# Patient Record
Sex: Female | Born: 1985 | Race: White | Hispanic: No | Marital: Married | Smoking: Never smoker
Health system: Southern US, Community
[De-identification: ages and names within clinical notes are randomized; demographics above are authoritative.]

## PROBLEM LIST (undated history)

## (undated) DIAGNOSIS — O139 Gestational [pregnancy-induced] hypertension without significant proteinuria, unspecified trimester: Secondary | ICD-10-CM

## (undated) DIAGNOSIS — E538 Deficiency of other specified B group vitamins: Secondary | ICD-10-CM

## (undated) DIAGNOSIS — B001 Herpesviral vesicular dermatitis: Secondary | ICD-10-CM

## (undated) DIAGNOSIS — I10 Essential (primary) hypertension: Secondary | ICD-10-CM

## (undated) HISTORY — DX: Gestational (pregnancy-induced) hypertension without significant proteinuria, unspecified trimester: O13.9

## (undated) HISTORY — PX: INTRAUTERINE DEVICE (IUD) INSERTION: SHX5877

## (undated) HISTORY — DX: Herpesviral vesicular dermatitis: B00.1

## (undated) HISTORY — DX: Deficiency of other specified B group vitamins: E53.8

## (undated) HISTORY — DX: Essential (primary) hypertension: I10

---
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
## 2005-08-06 ENCOUNTER — Emergency Department: Payer: Self-pay | Admitting: General Practice

## 2005-08-06 ENCOUNTER — Ambulatory Visit (HOSPITAL_COMMUNITY): Admission: RE | Admit: 2005-08-06 | Discharge: 2005-08-06 | Payer: Self-pay | Admitting: General Practice

## 2005-08-07 ENCOUNTER — Ambulatory Visit: Payer: Self-pay | Admitting: General Practice

## 2005-08-21 ENCOUNTER — Encounter: Payer: Self-pay | Admitting: General Practice

## 2006-05-22 ENCOUNTER — Ambulatory Visit: Payer: Self-pay | Admitting: Internal Medicine

## 2006-12-11 IMAGING — CR RIGHT HAND - COMPLETE 3+ VIEW
1 series · 3 of 3 positions shown · non-contrast
Comparison: none

REASON FOR EXAM: injury from [facility] :rm cardiac
COMMENTS:

PROCEDURE:     DXR - DXR HAND RT COMPLETE W/OBLIQUES  - August 06, 2005  [DATE]
RESULT:     Views of the RIGHT hand reveal a fracture through the base of
the first metacarpal.  I do not see other metacarpal fractures.  No
phalangeal fracture is seen. The bones of the wrist are grossly normal.

[Series 1: view not recorded · 0.17mm/px · 3 of 3 slices shown]
[im 1/3]
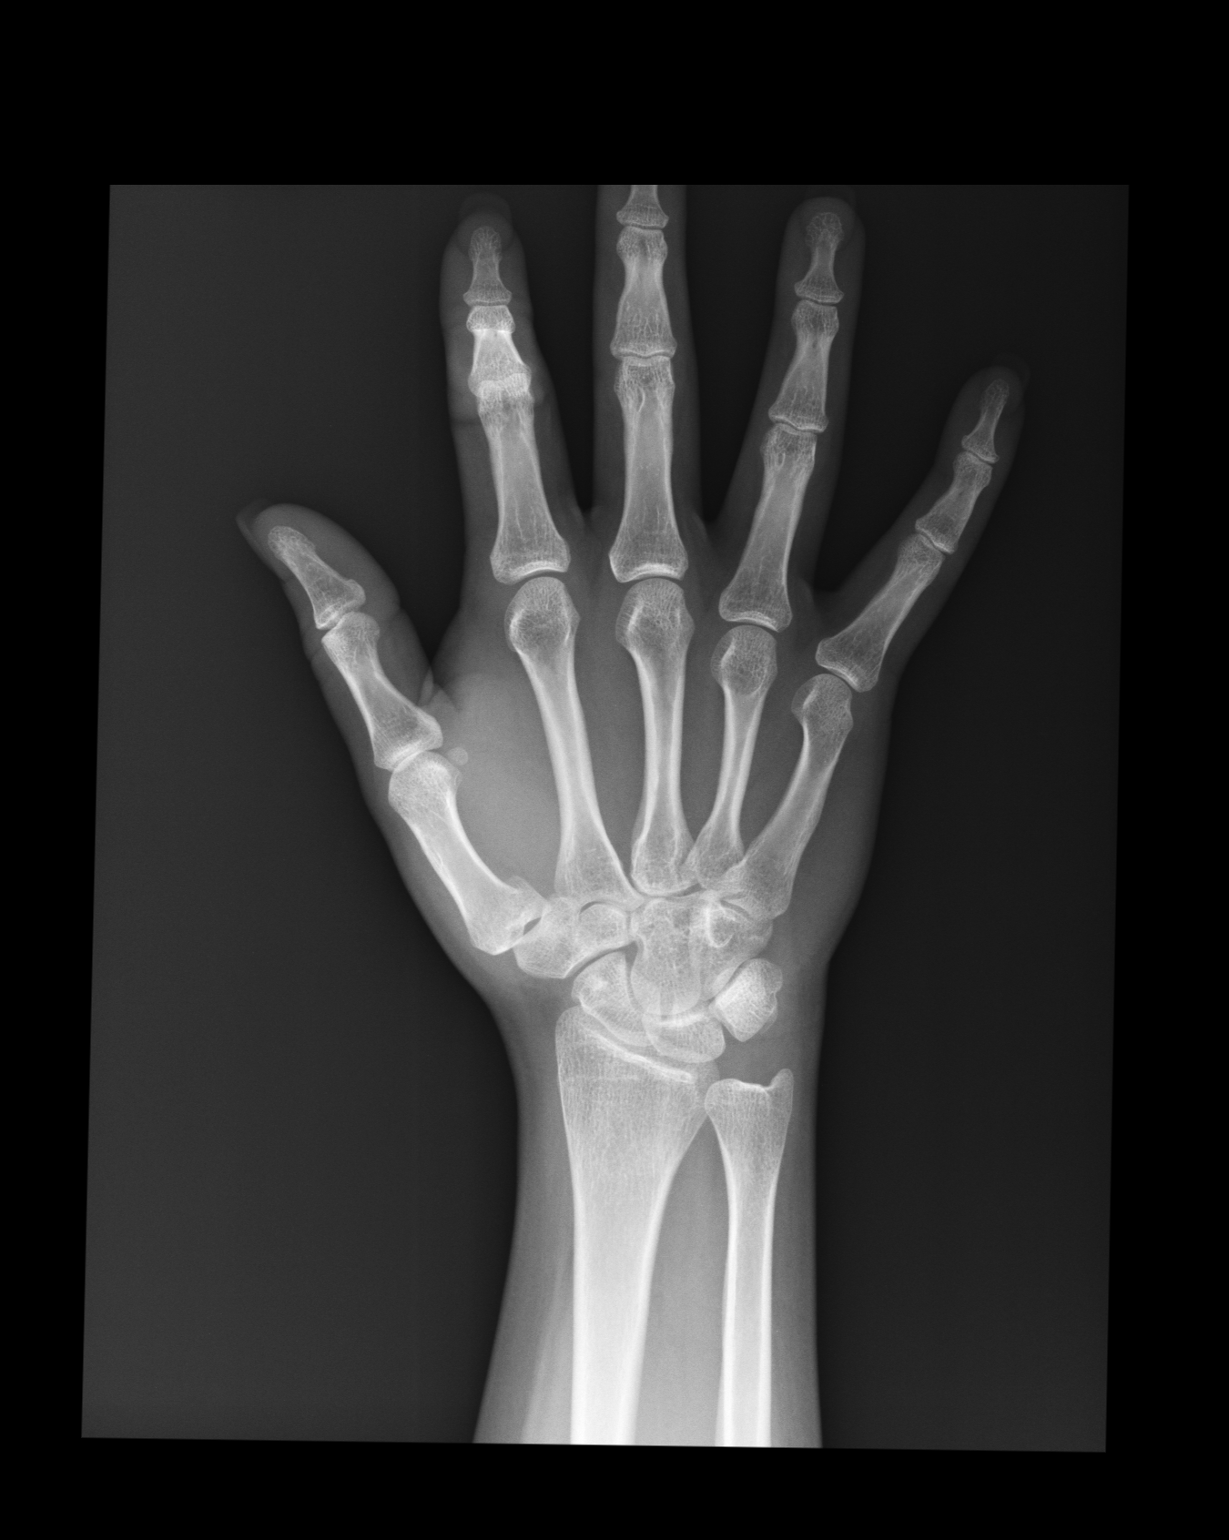
[im 2/3]
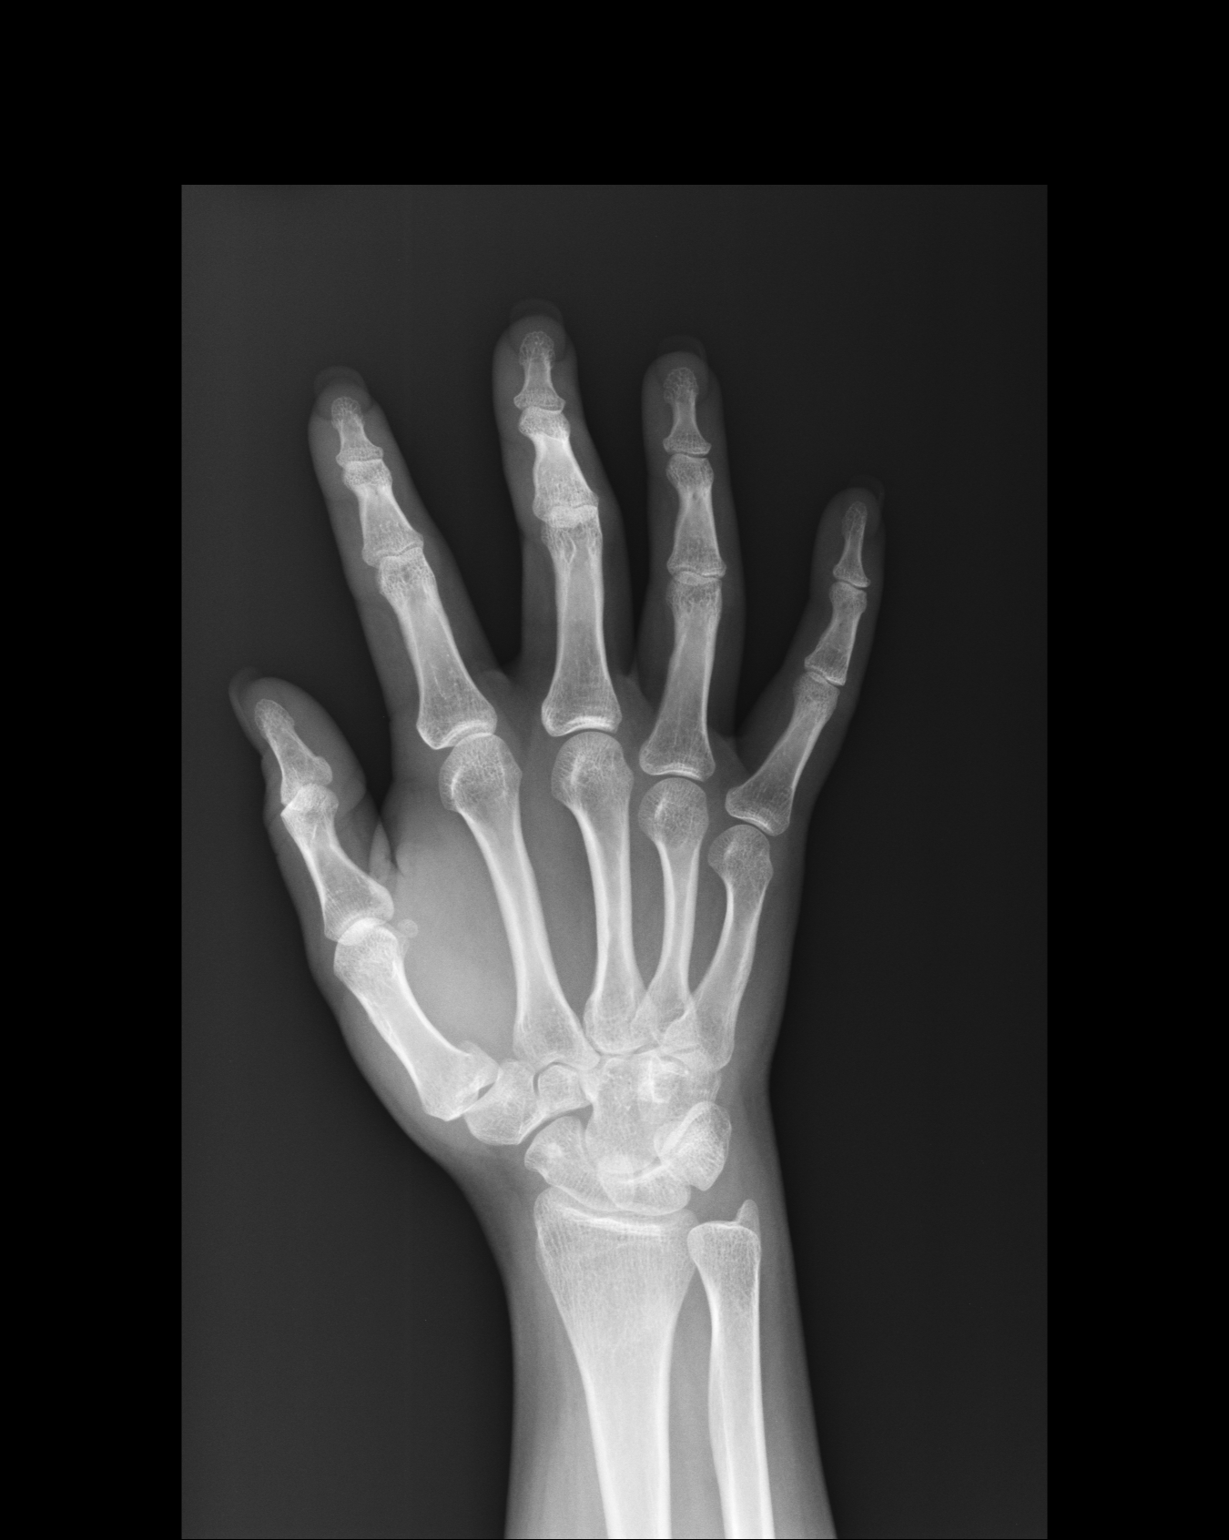
[im 3/3]
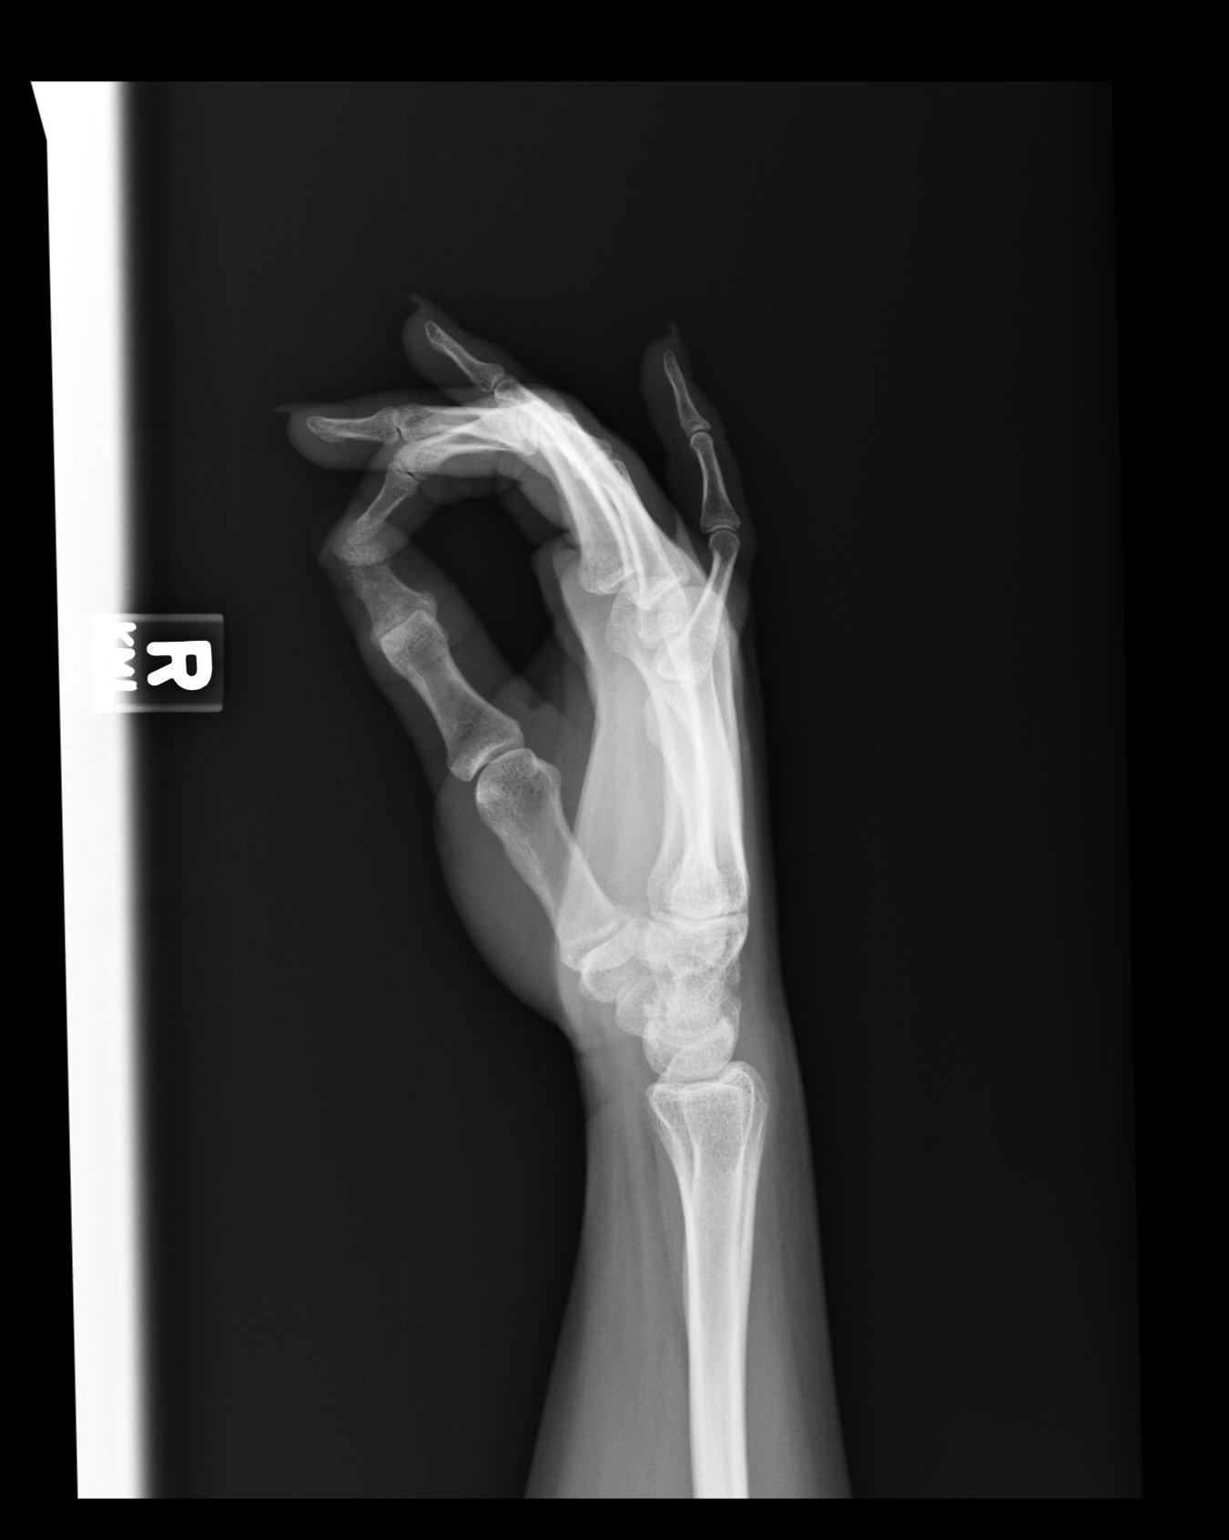

[3 of 3 positions shown; findings below may reference images not displayed]

IMPRESSION: 1)The patient sustained a fracture through the base of the first metacarpal.

## 2009-03-27 ENCOUNTER — Encounter: Payer: Self-pay | Admitting: Maternal and Fetal Medicine

## 2009-04-14 ENCOUNTER — Inpatient Hospital Stay: Payer: Self-pay | Admitting: Obstetrics & Gynecology

## 2009-04-24 ENCOUNTER — Encounter: Payer: Self-pay | Admitting: Maternal & Fetal Medicine

## 2009-04-27 ENCOUNTER — Encounter: Payer: Self-pay | Admitting: Obstetrics & Gynecology

## 2009-04-27 ENCOUNTER — Observation Stay: Payer: Self-pay

## 2009-04-29 ENCOUNTER — Observation Stay: Payer: Self-pay

## 2009-05-04 ENCOUNTER — Encounter: Payer: Self-pay | Admitting: Maternal and Fetal Medicine

## 2009-05-11 ENCOUNTER — Encounter: Payer: Self-pay | Admitting: Obstetrics & Gynecology

## 2009-05-15 ENCOUNTER — Inpatient Hospital Stay: Payer: Self-pay

## 2010-05-26 IMAGING — US US OB DETAIL+14 WK - NRPT [facility]
1 series · 14 of 28 positions shown · non-contrast
Comparison: none

[Series 1: us ob detail+14 wk - nrpt [facility] · 14 of 54 slices shown]
[im 2/54]
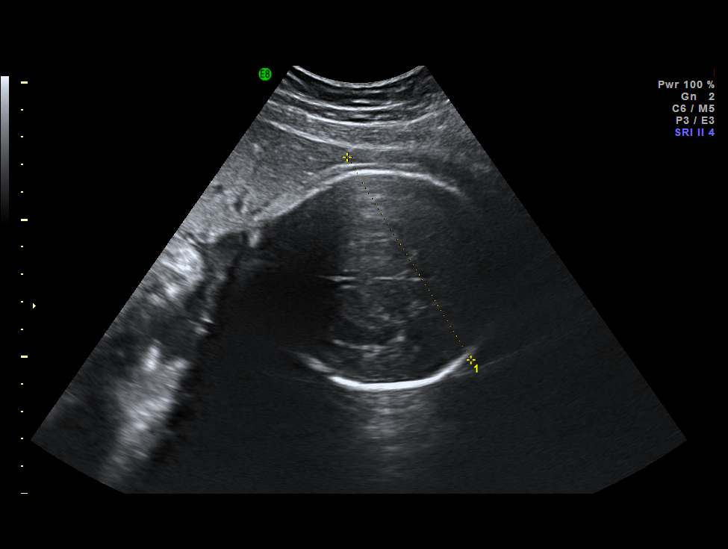
[im 6/54]
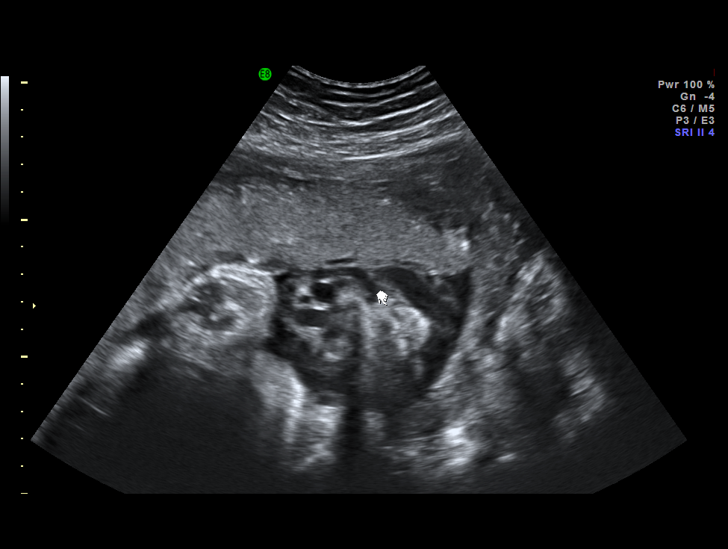
[im 10/54]
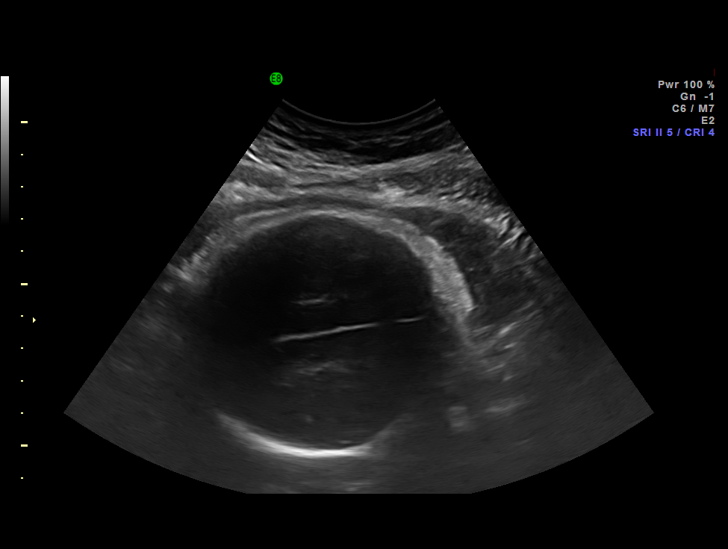
[im 14/54]
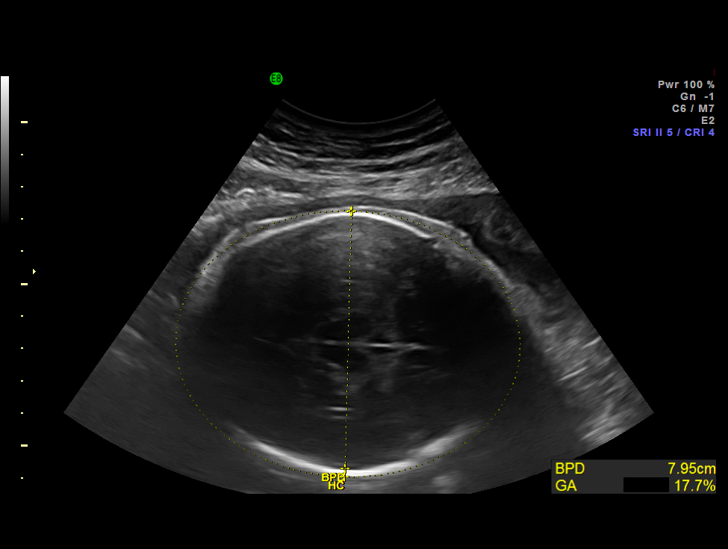
[im 18/54]
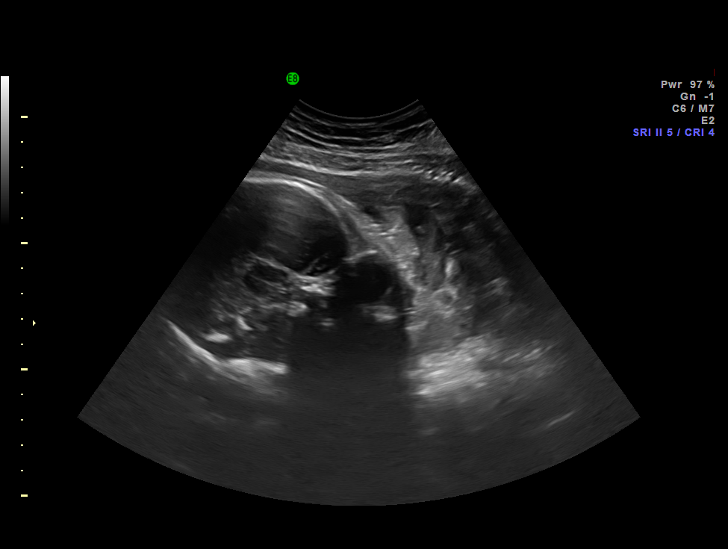
[im 22/54]
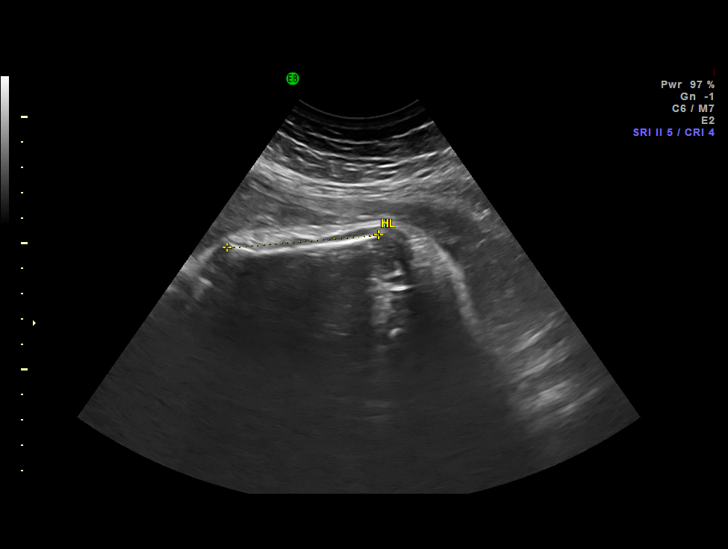
[im 26/54]
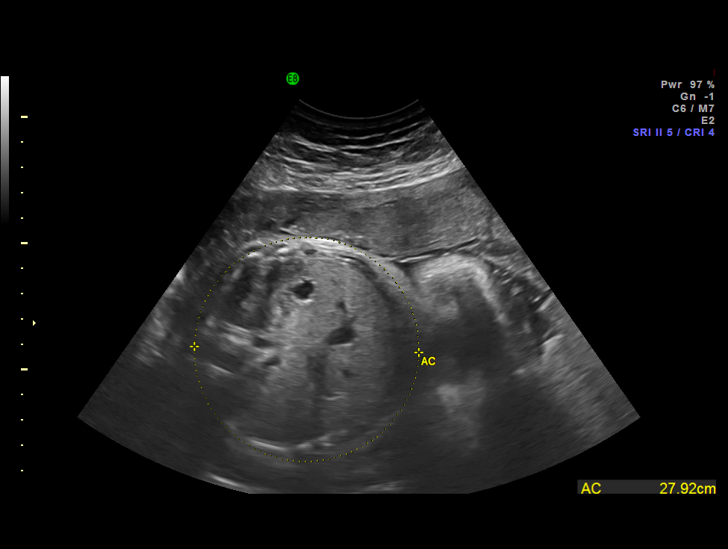
[im 30/54]
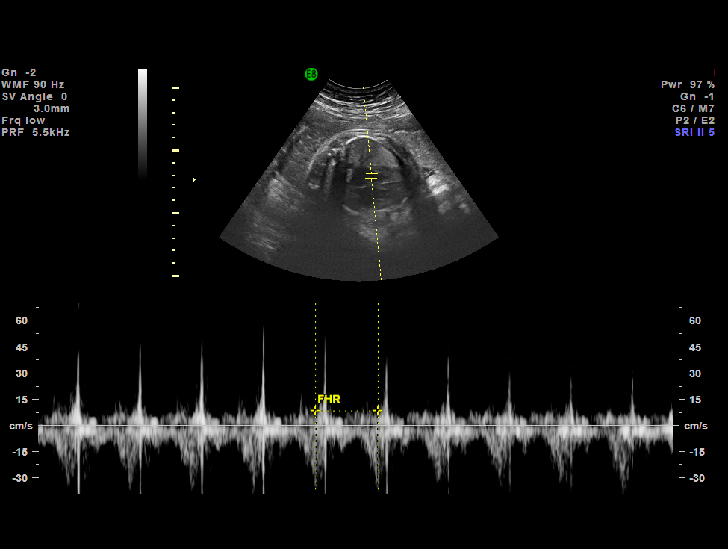
[im 34/54]
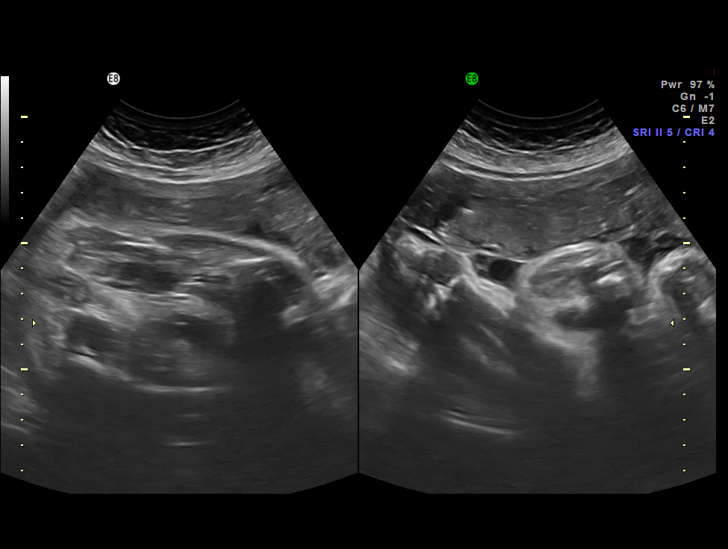
[im 38/54]
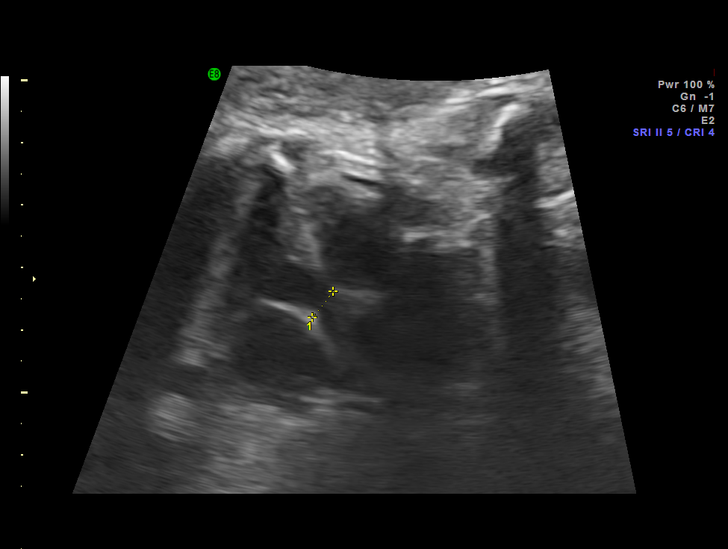
[im 42/54]
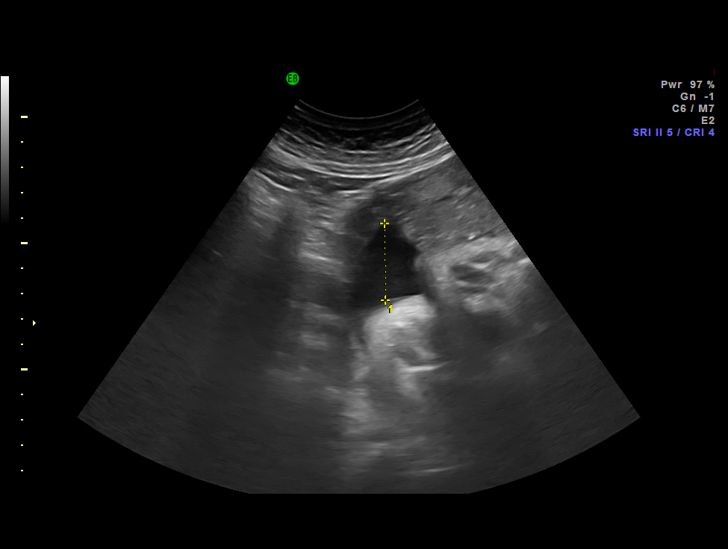
[im 46/54]
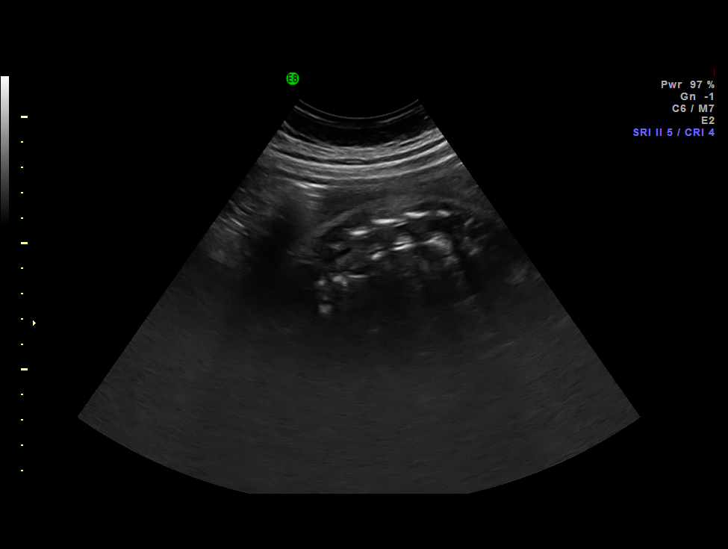
[im 50/54]
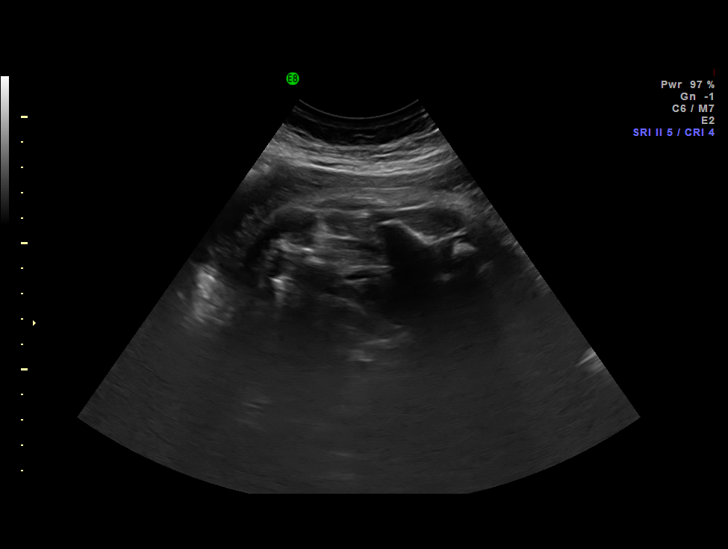
[im 54/54]
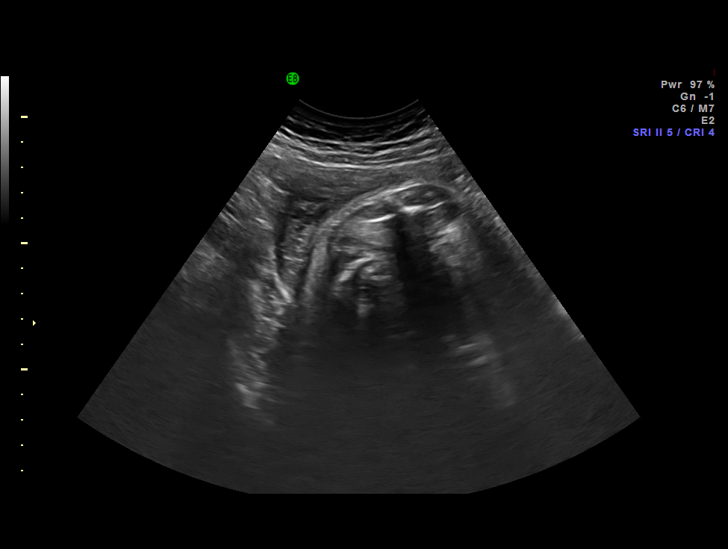

[14 of 28 positions shown; findings below may reference images not displayed]

IMAGES IMPORTED FROM THE SYNGO WORKFLOW SYSTEM
NO DICTATION FOR STUDY

## 2010-06-02 IMAGING — US ULTRAOUND OB LIMITED - NRPT [facility]
1 series · 13 of 13 positions shown · non-contrast
Comparison: none

[Series 1: ultraound ob limited - nrpt [facility] · 13 of 13 slices shown]
[im 1/13]
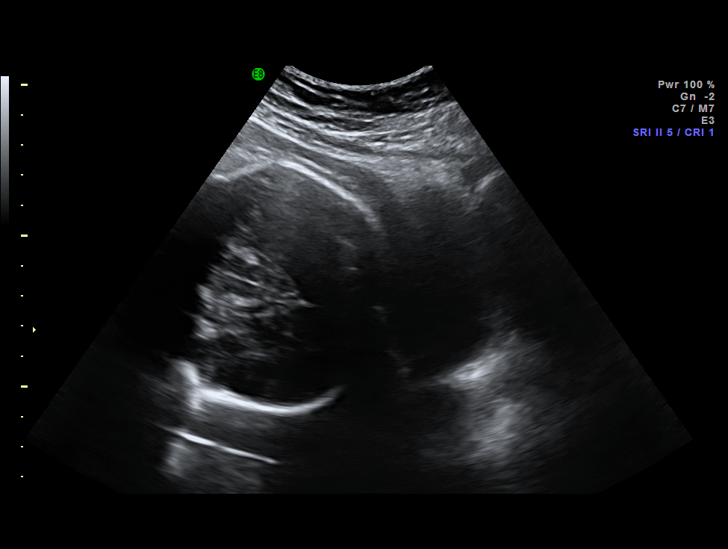
[im 2/13]
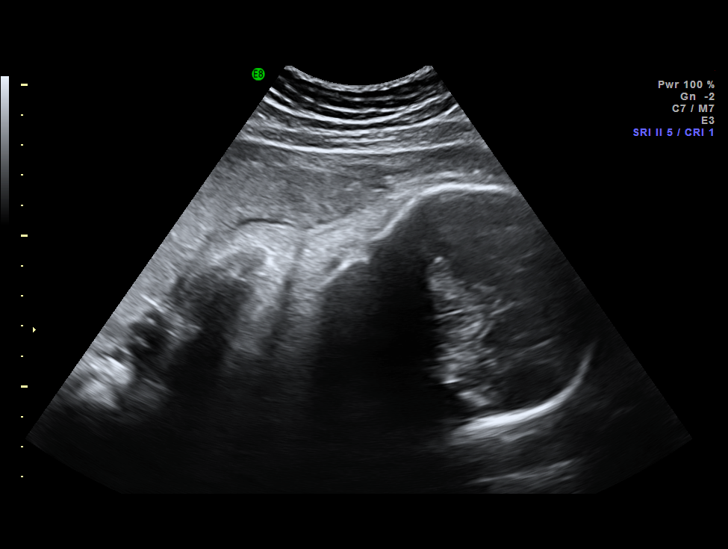
[im 3/13]
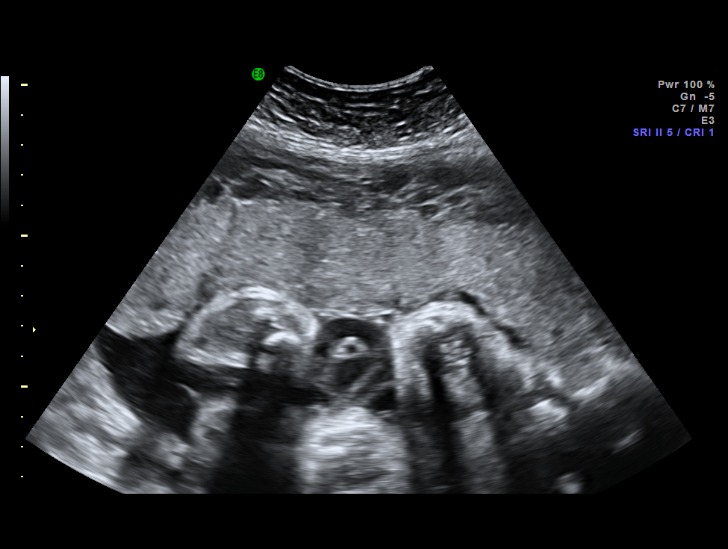
[im 4/13]
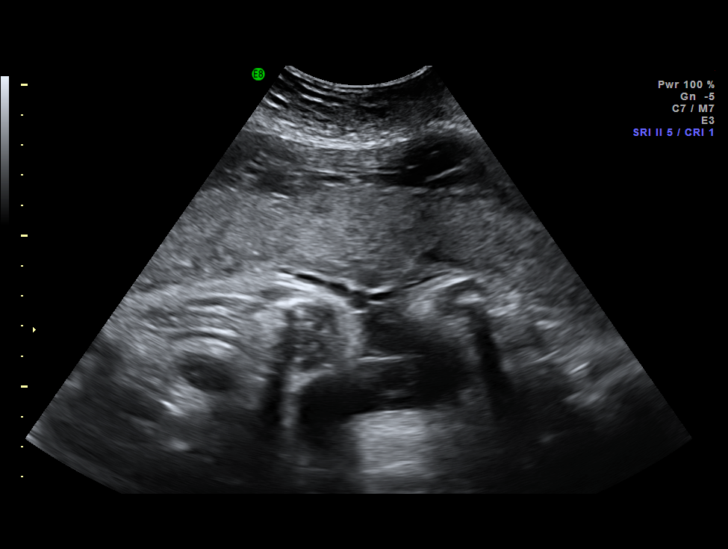
[im 5/13]
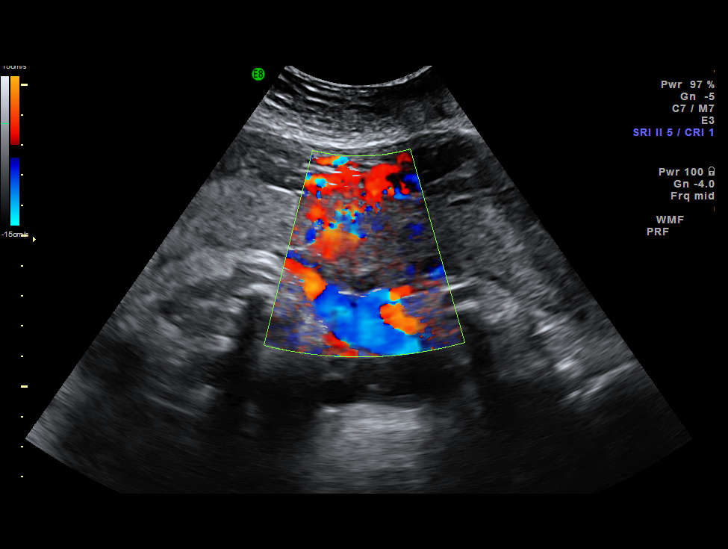
[im 6/13]
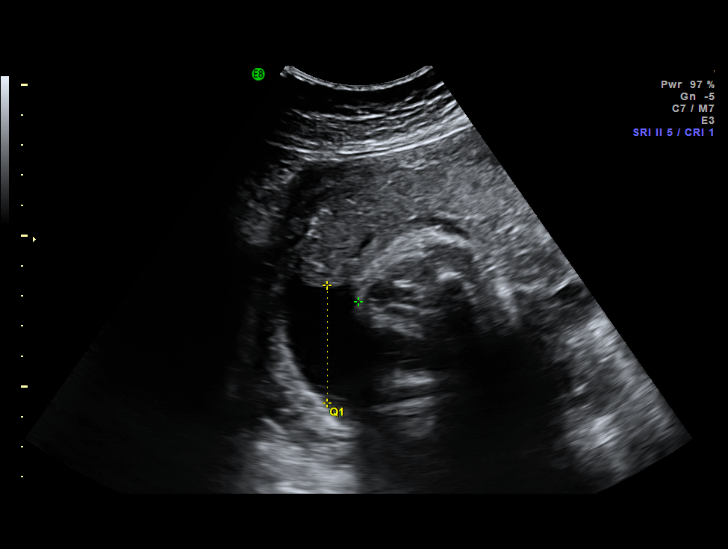
[im 7/13]
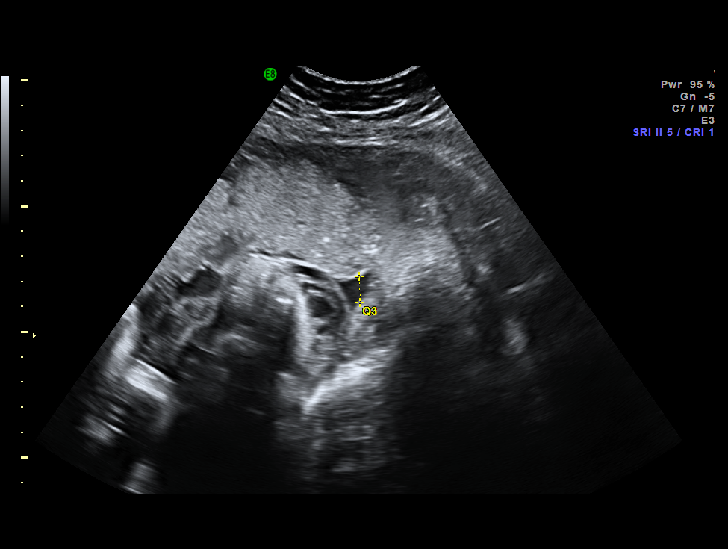
[im 8/13]
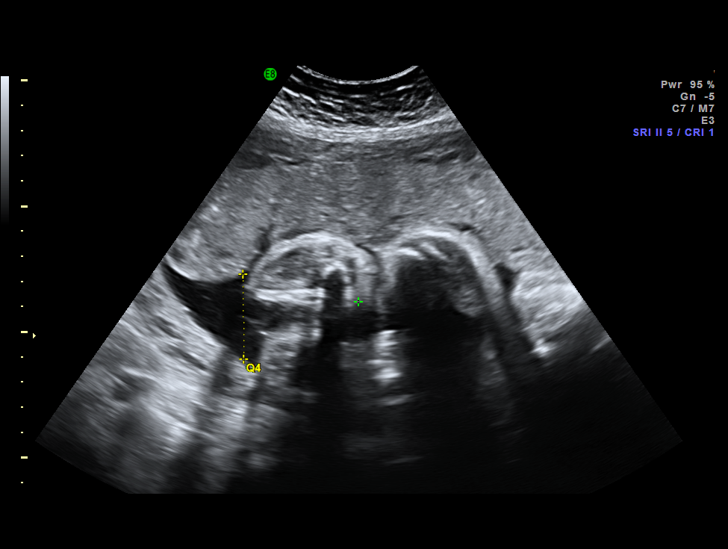
[im 9/13]
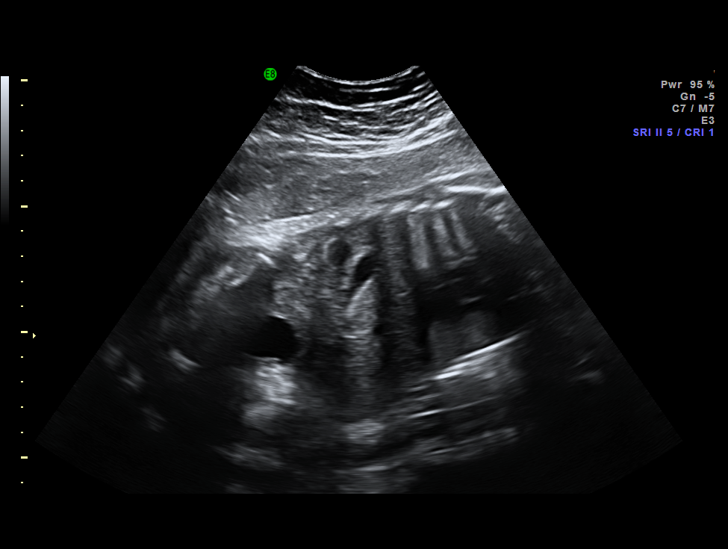
[im 10/13]
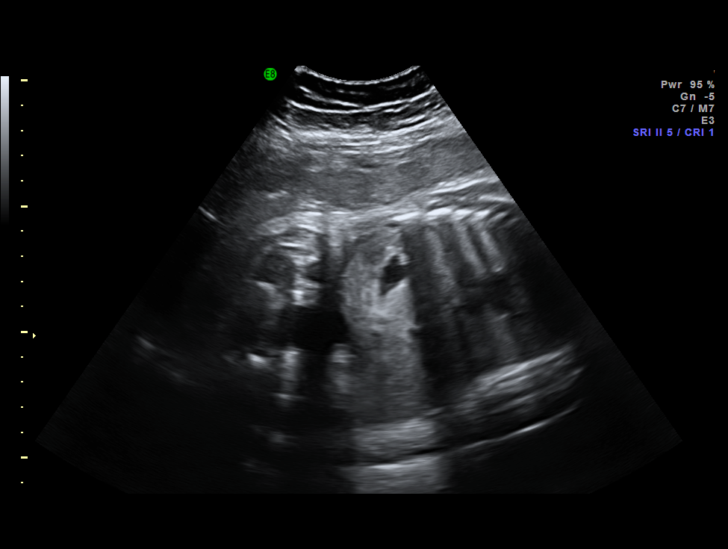
[im 11/13]
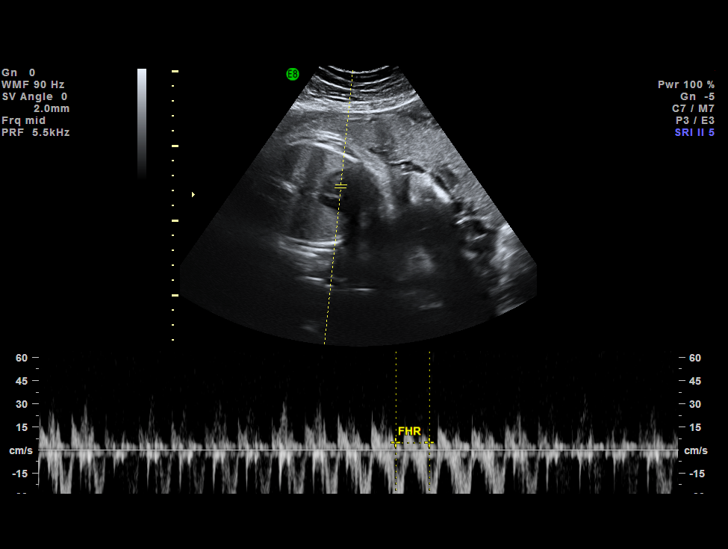
[im 12/13]
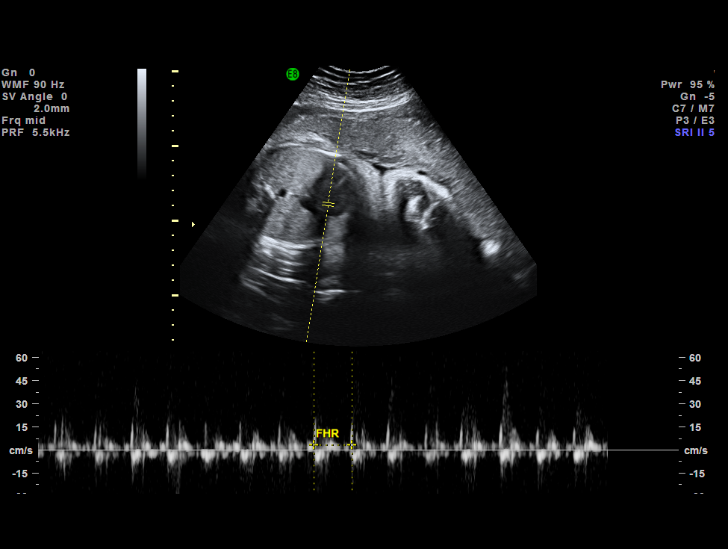
[im 13/13]
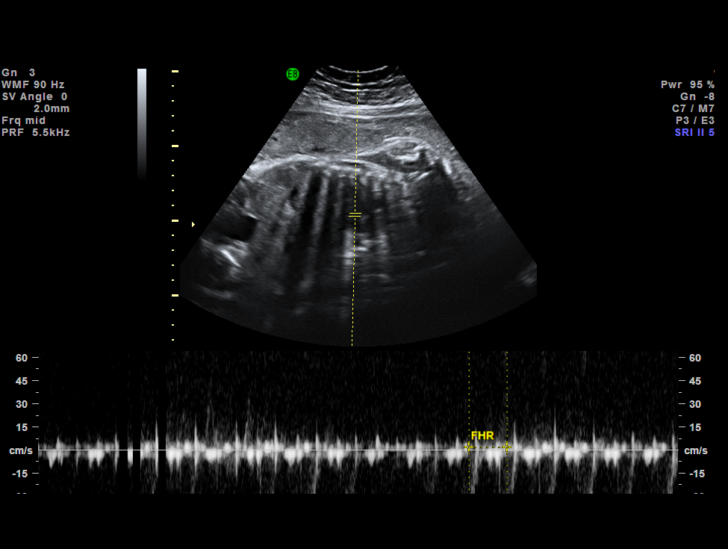

[13 of 13 positions shown; findings below may reference images not displayed]

IMAGES IMPORTED FROM THE SYNGO WORKFLOW SYSTEM
NO DICTATION FOR STUDY

## 2010-06-12 IMAGING — US US FETAL BPP W/O NON-STRESS - NRPT
1 series · 12 of 12 positions shown · non-contrast
Comparison: none

[Series 1: us fetal bpp w/o non-stress - nrpt · 12 of 12 slices shown]
[im 1/12]
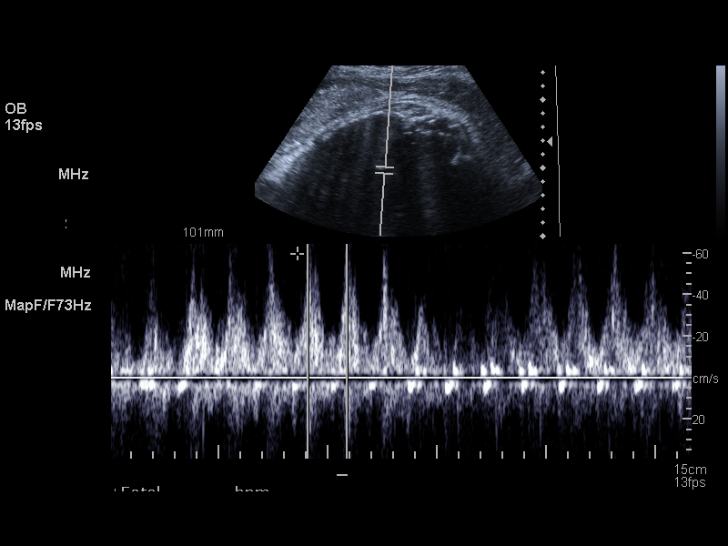
[im 2/12]
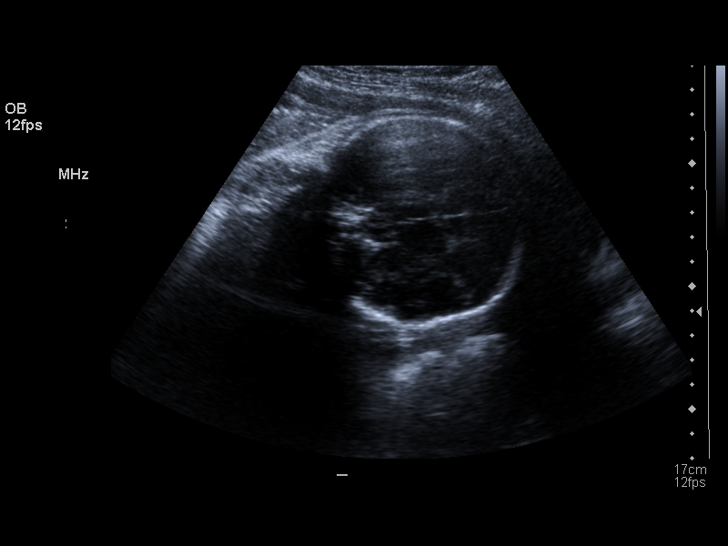
[im 3/12]
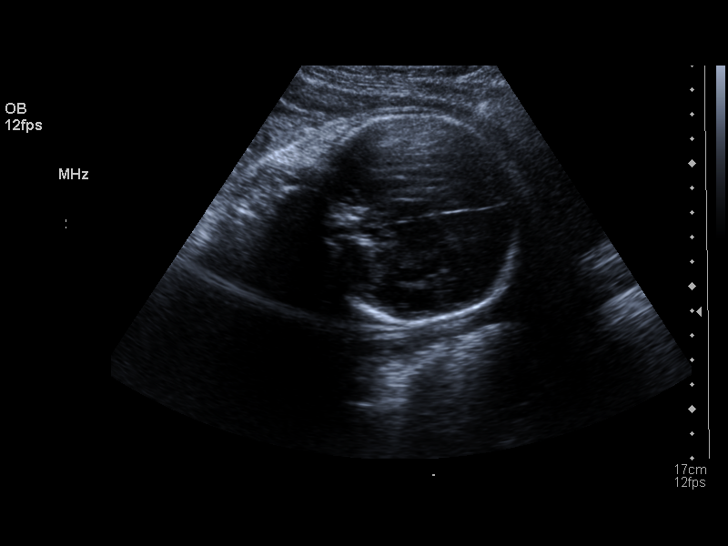
[im 4/12]
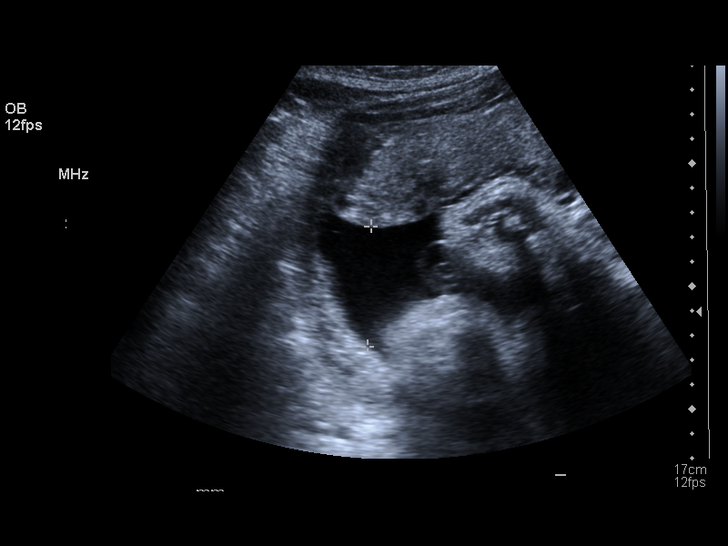
[im 5/12]
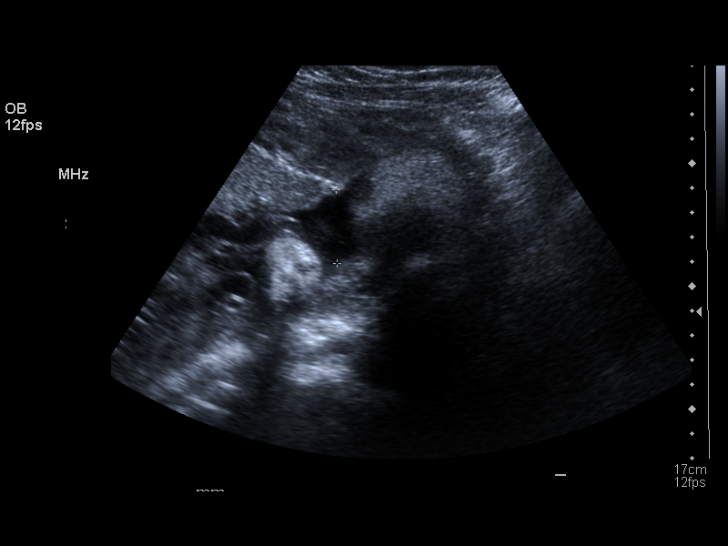
[im 6/12]
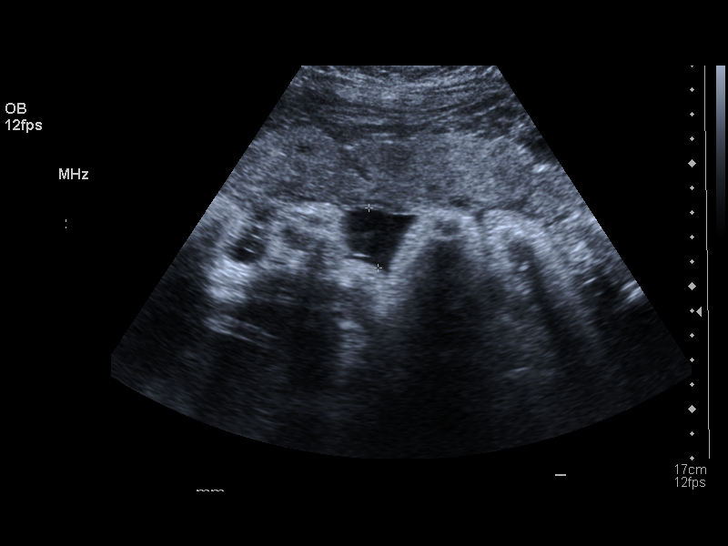
[im 7/12]
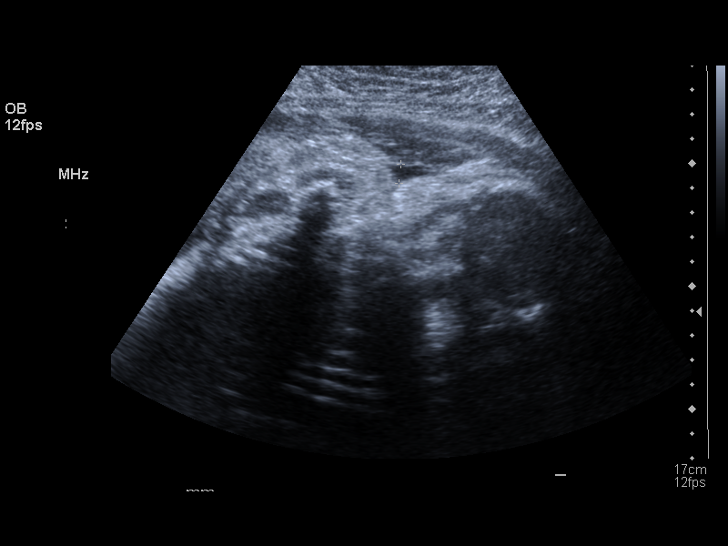
[im 8/12]
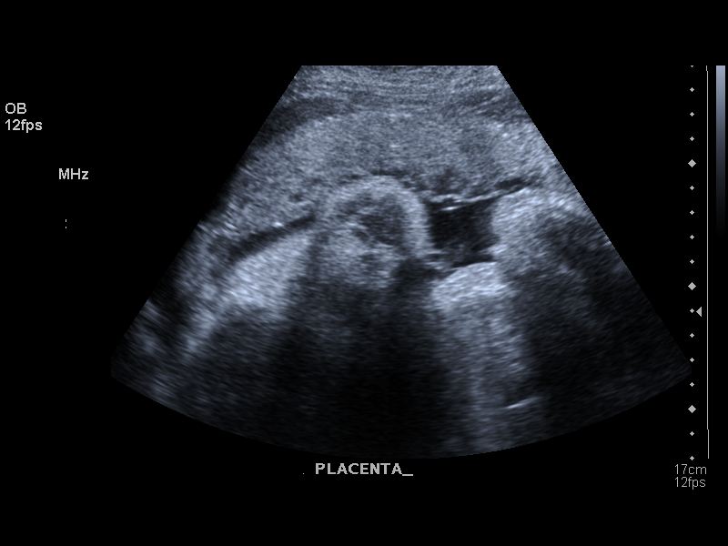
[im 9/12]
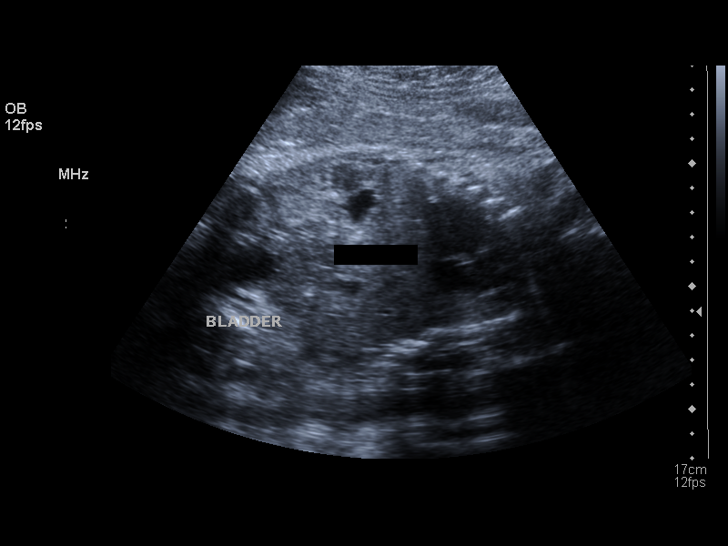
[im 10/12]
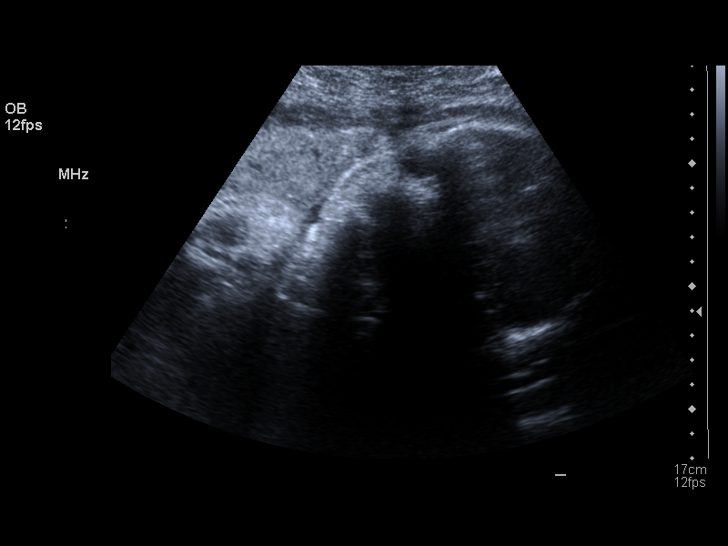
[im 11/12]
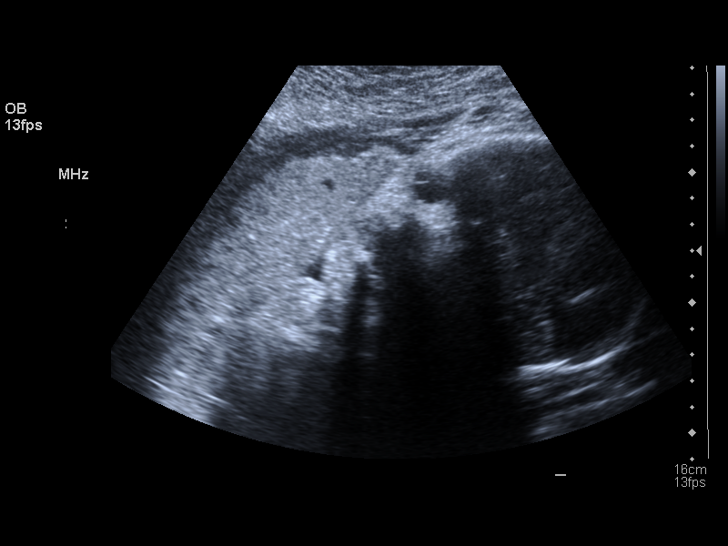
[im 12/12]
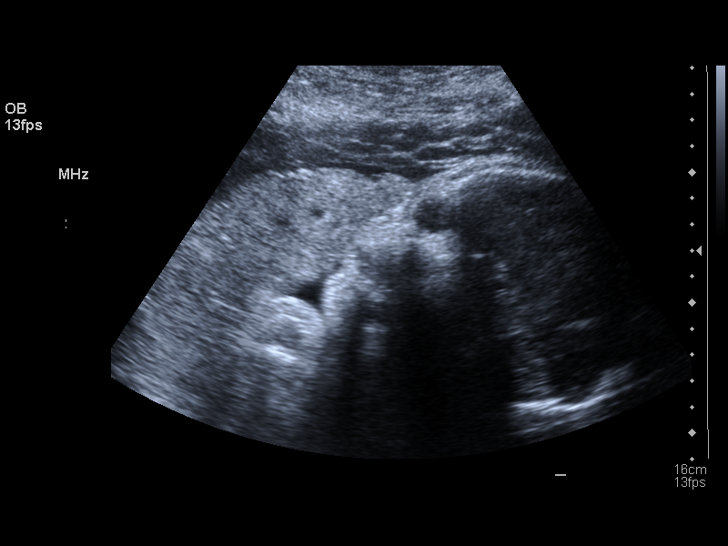

[12 of 12 positions shown; findings below may reference images not displayed]

IMAGES IMPORTED FROM THE SYNGO WORKFLOW SYSTEM
NO DICTATION FOR STUDY

## 2014-10-19 LAB — HM PAP SMEAR

## 2016-04-23 ENCOUNTER — Ambulatory Visit: Payer: Self-pay | Admitting: Obstetrics and Gynecology

## 2016-05-30 ENCOUNTER — Encounter: Payer: Self-pay | Admitting: Obstetrics and Gynecology

## 2016-06-05 ENCOUNTER — Encounter: Payer: Self-pay | Admitting: Obstetrics and Gynecology

## 2016-06-05 ENCOUNTER — Ambulatory Visit (INDEPENDENT_AMBULATORY_CARE_PROVIDER_SITE_OTHER): Payer: 59 | Admitting: Obstetrics and Gynecology

## 2016-06-05 VITALS — BP 124/80 | HR 65 | Ht 64.0 in | Wt 206.0 lb

## 2016-06-05 DIAGNOSIS — R87612 Low grade squamous intraepithelial lesion on cytologic smear of cervix (LGSIL): Secondary | ICD-10-CM | POA: Diagnosis not present

## 2016-06-05 DIAGNOSIS — Z01419 Encounter for gynecological examination (general) (routine) without abnormal findings: Secondary | ICD-10-CM | POA: Diagnosis not present

## 2016-06-05 DIAGNOSIS — Z124 Encounter for screening for malignant neoplasm of cervix: Secondary | ICD-10-CM | POA: Diagnosis not present

## 2016-06-05 NOTE — Patient Instructions (Signed)
Preventive Care 18-39 Years, Female Preventive care refers to lifestyle choices and visits with your health care provider that can promote health and wellness. What does preventive care include?  A yearly physical exam. This is also called an annual well check.  Dental exams once or twice a year.  Routine eye exams. Ask your health care provider how often you should have your eyes checked.  Personal lifestyle choices, including:  Daily care of your teeth and gums.  Regular physical activity.  Eating a healthy diet.  Avoiding tobacco and drug use.  Limiting alcohol use.  Practicing safe sex.  Taking vitamin and mineral supplements as recommended by your health care provider. What happens during an annual well check? The services and screenings done by your health care provider during your annual well check will depend on your age, overall health, lifestyle risk factors, and family history of disease. Counseling  Your health care provider may ask you questions about your:  Alcohol use.  Tobacco use.  Drug use.  Emotional well-being.  Home and relationship well-being.  Sexual activity.  Eating habits.  Work and work environment.  Method of birth control.  Menstrual cycle.  Pregnancy history. Screening  You may have the following tests or measurements:  Height, weight, and BMI.  Diabetes screening. This is done by checking your blood sugar (glucose) after you have not eaten for a while (fasting).  Blood pressure.  Lipid and cholesterol levels. These may be checked every 5 years starting at age 20.  Skin check.  Hepatitis C blood test.  Hepatitis B blood test.  Sexually transmitted disease (STD) testing.  BRCA-related cancer screening. This may be done if you have a family history of breast, ovarian, tubal, or peritoneal cancers.  Pelvic exam and Pap test. This may be done every 3 years starting at age 21. Starting at age 30, this may be done every 5  years if you have a Pap test in combination with an HPV test. Discuss your test results, treatment options, and if necessary, the need for more tests with your health care provider. Vaccines  Your health care provider may recommend certain vaccines, such as:  Influenza vaccine. This is recommended every year.  Tetanus, diphtheria, and acellular pertussis (Tdap, Td) vaccine. You may need a Td booster every 10 years.  Varicella vaccine. You may need this if you have not been vaccinated.  HPV vaccine. If you are 26 or younger, you may need three doses over 6 months.  Measles, mumps, and rubella (MMR) vaccine. You may need at least one dose of MMR. You may also need a second dose.  Pneumococcal 13-valent conjugate (PCV13) vaccine. You may need this if you have certain conditions and were not previously vaccinated.  Pneumococcal polysaccharide (PPSV23) vaccine. You may need one or two doses if you smoke cigarettes or if you have certain conditions.  Meningococcal vaccine. One dose is recommended if you are age 19-21 years and a first-year college student living in a residence hall, or if you have one of several medical conditions. You may also need additional booster doses.  Hepatitis A vaccine. You may need this if you have certain conditions or if you travel or work in places where you may be exposed to hepatitis A.  Hepatitis B vaccine. You may need this if you have certain conditions or if you travel or work in places where you may be exposed to hepatitis B.  Haemophilus influenzae type b (Hib) vaccine. You may need this   if you have certain risk factors. Talk to your health care provider about which screenings and vaccines you need and how often you need them. This information is not intended to replace advice given to you by your health care provider. Make sure you discuss any questions you have with your health care provider. Document Released: 03/05/2001 Document Revised: 09/27/2015  Document Reviewed: 11/08/2014 Elsevier Interactive Patient Education  2017 [employer].

## 2016-06-05 NOTE — Progress Notes (Signed)
Patient ID: Jenny EvenerKristen Denise Cervantes, female   DOB: 12/23/1985, 31 y.o.   MRN: [id number]     Gynecology Annual Exam  PCP: Danella PentonMiller, Mark F, MD  Chief Complaint: No chief complaint on file.   History of Present Illness: Patient is a 31 y.o. No obstetric history on file. presents for annual exam. The patient has no complaints today.   LMP: No LMP recorded. Amenorrhea on Mirena IUD.  The patient is sexually active. She currently uses IUD: Present: yes for contraception. She denies dyspareunia.  The patient does perform self breast exams.  There is no notable family history of breast or ovarian cancer in her family.  The patient wears seatbelts: yes.   The patient has regular exercise: no.    The patient denies current symptoms of depression.    Review of Systems: Review of Systems  Constitutional: Negative for chills and fever.  HENT: Negative for congestion.   Respiratory: Negative for cough and shortness of breath.   Cardiovascular: Negative for chest pain and palpitations.  Gastrointestinal: Negative for abdominal pain, constipation, diarrhea, heartburn, nausea and vomiting.  Genitourinary: Negative for dysuria, frequency and urgency.  Skin: Negative for itching and rash.  Neurological: Negative for dizziness and headaches.  Endo/Heme/Allergies: Negative for polydipsia.  Psychiatric/Behavioral: Negative for depression.    Past Medical History:  Past Medical History:  Diagnosis Date  . PIH (pregnancy induced hypertension)     Past Surgical History:  Past Surgical History:  Procedure Laterality Date  . CARDIAC SURGERY  2000   Tumor removed  . INTRAUTERINE DEVICE (IUD) INSERTION  2006, 2011   Paragard    Gynecologic History:  No LMP recorded. Contraception: IUD 07/20/2012 Last Pap: Results were: 10/19/2014 LSIL negative colposcopy   Obstetric History: No obstetric history on file.  Family History:  Family History  Problem Relation Age of Onset  . Breast cancer Maternal  Aunt     Social History:  Social History   Social History  . Marital status: Married    Spouse name: N/A  . Number of children: N/A  . Years of education: N/A   Occupational History  . Not on file.   Social History Main Topics  . Smoking status: Not on file  . Smokeless tobacco: Not on file  . Alcohol use Not on file  . Drug use: Unknown  . Sexual activity: Not on file   Other Topics Concern  . Not on file   Social History Narrative  . No narrative on file    Allergies:  Allergies not on file  Medications: Prior to Admission medications   Not on File    Physical Exam Vitals: There were no vitals taken for this visit.  General: NAD HEENT: normocephalic, anicteric Thyroid: no enlargement, no palpable nodules Pulmonary: No increased work of breathing, CTAB Cardiovascular: RRR, distal pulses 2+ Breast: Breast symmetrical, no tenderness, no palpable nodules or masses, no skin or nipple retraction present, no nipple discharge.  No axillary or supraclavicular lymphadenopathy. Abdomen: NABS, soft, non-tender, non-distended.  Umbilicus without lesions.  No hepatomegaly, splenomegaly or masses palpable. No evidence of hernia  Genitourinary:  External: Normal external female genitalia.  Normal urethral meatus, normal Bartholin's and Skene's glands.    Vagina: Normal vaginal mucosa, no evidence of prolapse.    Cervix: Grossly normal in appearance, no bleeding  Uterus: Non-enlarged, mobile, normal contour.  No CMT, IUD strings visualized  Adnexa: ovaries non-enlarged, no adnexal masses  Rectal: deferred  Lymphatic: no evidence of inguinal lymphadenopathy  Extremities: no edema, erythema, or tenderness Neurologic: Grossly intact Psychiatric: mood appropriate, affect full  Female chaperone present for pelvic and breast  portions of the physical exam    Assessment: 31 y.o. routine annual  Plan: Problem List Items Addressed This Visit    None    Visit Diagnoses     Low grade squamous intraepithelial lesion (LGSIL) on cervical Pap smear    -  Primary   Relevant Orders   PapIG, HPV, rfx 16/18   Screening for malignant neoplasm of cervix       Relevant Orders   PapIG, HPV, rfx 16/18   Encounter for gynecological examination without abnormal finding       Relevant Orders   PapIG, HPV, rfx 16/18      1) STI screening was offered and declined  2) ASCCP guidelines and rational discussed.  Patient opts for yearly screening interval  3) Contraception - Paragard placed 07/20/2012  4) Routine healthcare maintenance including cholesterol, diabetes screening discussed managed by PCP  5) Follow up 1 year for routine annual exam

## 2016-06-10 LAB — PAPIG, HPV, RFX 16/18
HPV, high-risk: NEGATIVE
PAP SMEAR COMMENT: 0

## 2016-06-11 ENCOUNTER — Encounter: Payer: Self-pay | Admitting: Obstetrics and Gynecology

## 2018-07-08 ENCOUNTER — Other Ambulatory Visit: Payer: Self-pay

## 2018-07-08 ENCOUNTER — Ambulatory Visit (INDEPENDENT_AMBULATORY_CARE_PROVIDER_SITE_OTHER): Payer: 59 | Admitting: Obstetrics and Gynecology

## 2018-07-08 ENCOUNTER — Other Ambulatory Visit (HOSPITAL_COMMUNITY)
Admission: RE | Admit: 2018-07-08 | Discharge: 2018-07-08 | Disposition: A | Discharge status: Home / Self Care | Payer: 59 | Source: Ambulatory Visit | Attending: Obstetrics and Gynecology | Admitting: Obstetrics and Gynecology

## 2018-07-08 ENCOUNTER — Encounter: Payer: Self-pay | Admitting: Obstetrics and Gynecology

## 2018-07-08 VITALS — BP 120/82 | HR 58 | Ht 64.0 in | Wt 210.0 lb

## 2018-07-08 DIAGNOSIS — Z124 Encounter for screening for malignant neoplasm of cervix: Secondary | ICD-10-CM | POA: Diagnosis present

## 2018-07-08 DIAGNOSIS — Z01419 Encounter for gynecological examination (general) (routine) without abnormal findings: Secondary | ICD-10-CM | POA: Diagnosis not present

## 2018-07-08 DIAGNOSIS — Z1239 Encounter for other screening for malignant neoplasm of breast: Secondary | ICD-10-CM

## 2018-07-08 NOTE — Progress Notes (Signed)
Gynecology Annual Exam   PCP: Rusty Aus, MD  Chief Complaint:  Chief Complaint  Patient presents with  . Gynecologic Exam    History of Present Illness: Patient is a 33 y.o. G2P1011 presents for annual exam. The patient has no complaints today.   LMP: Patient's last menstrual period was 07/03/2018 (exact date). Average Interval: regular, 28 days Duration of flow: 5 days Heavy Menses: no Clots: no Intermenstrual Bleeding: no Postcoital Bleeding: no Dysmenorrhea: no  The patient is sexually active. She currently uses IUD for contraception. She denies dyspareunia.  There is no notable family history of breast or ovarian cancer in her family.  The patient wears seatbelts: yes.   The patient has regular exercise: not asked.    The patient denies current symptoms of depression.    Review of Systems: Review of Systems  Constitutional: Negative for chills and fever.  HENT: Negative for congestion.   Respiratory: Negative for cough and shortness of breath.   Cardiovascular: Negative for chest pain and palpitations.  Gastrointestinal: Negative for abdominal pain, constipation, diarrhea, heartburn, nausea and vomiting.  Genitourinary: Negative for dysuria, frequency and urgency.  Skin: Negative for itching and rash.  Neurological: Negative for dizziness and headaches.  Endo/Heme/Allergies: Negative for polydipsia.  Psychiatric/Behavioral: Negative for depression.    Past Medical History:  Past Medical History:  Diagnosis Date  . B12 deficiency   . PIH (pregnancy induced hypertension)     Past Surgical History:  Past Surgical History:  Procedure Laterality Date  . CARDIAC SURGERY  2000   Tumor removed  . INTRAUTERINE DEVICE (IUD) INSERTION  2006, 2011   Paragard    Gynecologic History:  Patient's last menstrual period was 07/03/2018 (exact date). Contraception:07/20/2012 Parguard IUD Last Pap: Results were: 06/05/2016 ASCUS with NEGATIVE high risk HPV    Obstetric History: [id number]  Family History:  History reviewed. No pertinent family history.  Social History:  Social History   Socioeconomic History  . Marital status: Married    Spouse name: Not on file  . Number of children: Not on file  . Years of education: Not on file  . Highest education level: Not on file  Occupational History  . Not on file  Social Needs  . Financial resource strain: Not on file  . Food insecurity    Worry: Not on file    Inability: Not on file  . Transportation needs    Medical: Not on file    Non-medical: Not on file  Tobacco Use  . Smoking status: Never Smoker  . Smokeless tobacco: Never Used  Substance and Sexual Activity  . Alcohol use: Yes    Comment: Rare  . Drug use: No  . Sexual activity: Yes    Birth control/protection: I.U.D.  Lifestyle  . Physical activity    Days per week: Not on file    Minutes per session: Not on file  . Stress: Not on file  Relationships  . Social Herbalist on phone: Not on file    Gets together: Not on file    Attends religious service: Not on file    Active member of club or organization: Not on file    Attends meetings of clubs or organizations: Not on file    Relationship status: Not on file  . Intimate partner violence    Fear of current or ex partner: Not on file    Emotionally abused: Not on file    Physically abused:  Not on file    Forced sexual activity: Not on file  Other Topics Concern  . Not on file  Social History Narrative  . Not on file    Allergies:  Allergies  Allergen Reactions  . Sulfa Antibiotics Other (See Comments)    Other Reaction: throat swelling  . Penicillins Hives and Rash  . Sertraline Other (See Comments)    Medications: Prior to Admission medications   Medication Sig Start Date End Date Taking? Authorizing Provider  cyanocobalamin 1000 MCG tablet Take 1,000 mcg by mouth daily.   Yes [provider]  lisinopril-hydrochlorothiazide  (ZESTORETIC) 10-12.5 MG tablet Take 1 tablet by mouth daily. 04/20/18  Yes [provider]  PARAGARD INTRAUTERINE COPPER IU by Intrauterine route.   Yes [provider]  azithromycin (ZITHROMAX) 500 MG tablet TAKE 1 TABLET BY MOUTH 1 HOUR PRIOR TO DENTAL APPOINTMENT 05/23/16   [provider]    Physical Exam Vitals: Blood pressure 120/82, pulse (!) 58, height 5\' 4"  (1.626 m), weight 210 lb (95.3 kg), last menstrual period 07/03/2018.  General: NAD HEENT: normocephalic, anicteric Thyroid: no enlargement, no palpable nodules Pulmonary: No increased work of breathing, CTAB Cardiovascular: RRR, distal pulses 2+ Breast: Breast symmetrical, no tenderness, no palpable nodules or masses, no skin or nipple retraction present, no nipple discharge.  No axillary or supraclavicular lymphadenopathy. Abdomen: NABS, soft, non-tender, non-distended.  Umbilicus without lesions.  No hepatomegaly, splenomegaly or masses palpable. No evidence of hernia  Genitourinary:  External: Normal external female genitalia.  Normal urethral meatus, normal Bartholin's and Skene's glands.    Vagina: Normal vaginal mucosa, no evidence of prolapse.    Cervix: Grossly normal in appearance, no bleeding, IUD strings visualzied 3cm  Uterus: Non-enlarged, mobile, normal contour.  No CMT  Adnexa: ovaries non-enlarged, no adnexal masses  Rectal: deferred  Lymphatic: no evidence of inguinal lymphadenopathy Extremities: no edema, erythema, or tenderness Neurologic: Grossly intact Psychiatric: mood appropriate, affect full  Female chaperone present for pelvic and breast  portions of the physical exam    Assessment: 33 y.o. G2P1011 routine annual exam  Plan: Problem List Items Addressed This Visit    None    Visit Diagnoses    Encounter for gynecological examination without abnormal finding    -  Primary   Screening for malignant neoplasm of cervix       Relevant Orders   Cytology - PAP   Breast  screening          2) STI screening  was notoffered and therefore not obtained  2)  ASCCP guidelines and rational discussed.  Patient opts for yearly screening interval - given history of prior abnormal opts for yearly paps  3) Contraception - the patient is currently using  IUD.  She is happy with her current form of contraception and plans to continue  4) Routine healthcare maintenance including cholesterol, diabetes screening discussed managed by PCP  5) Return in about 1 year (around 07/08/2019) for annual.   Vena AustriaAndreas Emmanuella Mirante, MD, Merlinda FrederickFACOG [facility], [facility] 07/08/2018, 10:45 AM

## 2018-07-10 LAB — CYTOLOGY - PAP
Diagnosis: NEGATIVE
HPV: NOT DETECTED

## 2021-04-18 NOTE — Progress Notes (Signed)
? ?PCP:  Danella Penton, MD ? ? ?Chief Complaint  ?Patient presents with  ? Gynecologic Exam  ?  No concerns  ? ? ? ?HPI: ?     Ms. Jenny Cervantes is a 36 y.o. G2P1011 whose LMP was Patient's last menstrual period was 04/19/2021 (exact date)., presents today for her annual examination.  Her menses are regular every 28-30 days, lasting 7 days, mod flow.  Dysmenorrhea mild, occurring first 1-2 days of flow. She has occas intermenstrual bleeding. ? ?Sex activity: single partner, contraception - IUD. Paragard placed 07/20/12, will want another one next yr.  ?Last Pap: 07/08/18 Results were: no abnormalities /neg HPV DNA;l 06/05/16 pap was ASCUS/neg HPV DNA.  ? ?There is a FH of breast cancer in her mat grt aunt, genetic testing not indicated. There is no FH of ovarian cancer. The patient does do self-breast exams. ? ?Tobacco use: The patient denies current or previous tobacco use. ?Alcohol use: none ?No drug use.  ?Exercise: moderately active ? ?She does get adequate calcium but not Vitamin D in her diet. ? ?Past Medical History:  ?Diagnosis Date  ? B12 deficiency   ? Cold sore   ? Hypertension   ? sx improved, may be able to come off meds  ? PIH (pregnancy induced hypertension)   ? ? ? ?Past Surgical History:  ?Procedure Laterality Date  ? CARDIAC SURGERY  2000  ? Tumor removed  ? INTRAUTERINE DEVICE (IUD) INSERTION  2006, 2011  ? Paragard  ? ? ?History reviewed. No pertinent family history. ? ?Social History  ? ?Socioeconomic History  ? Marital status: Married  ?  Spouse name: Not on file  ? Number of children: Not on file  ? Years of education: Not on file  ? Highest education level: Not on file  ?Occupational History  ? Not on file  ?Tobacco Use  ? Smoking status: Never  ? Smokeless tobacco: Never  ?Vaping Use  ? Vaping Use: Never used  ?Substance and Sexual Activity  ? Alcohol use: Yes  ?  Comment: Rare  ? Drug use: No  ? Sexual activity: Yes  ?  Birth control/protection: I.U.D.  ?  Comment: Paragard  ?Other  Topics Concern  ? Not on file  ?Social History Narrative  ? Not on file  ? ?Social Determinants of Health  ? ?Financial Resource Strain: Not on file  ?Food Insecurity: Not on file  ?Transportation Needs: Not on file  ?Physical Activity: Not on file  ?Stress: Not on file  ?Social Connections: Not on file  ?Intimate Partner Violence: Not on file  ? ? ? ?Current Outpatient Medications:  ?  lisinopril-hydrochlorothiazide (ZESTORETIC) 10-12.5 MG tablet, Take 1 tablet by mouth daily., Disp: , Rfl:  ?  PARAGARD INTRAUTERINE COPPER IU, by Intrauterine route., Disp: , Rfl:  ?  valACYclovir (VALTREX) 500 MG tablet, Take 500 mg by mouth 2 (two) times daily., Disp: , Rfl:  ? ? ? ? ?ROS: ? ?Review of Systems  ?Constitutional:  Negative for fatigue, fever and unexpected weight change.  ?Respiratory:  Negative for cough, shortness of breath and wheezing.   ?Cardiovascular:  Negative for chest pain, palpitations and leg swelling.  ?Gastrointestinal:  Negative for blood in stool, constipation, diarrhea, nausea and vomiting.  ?Endocrine: Negative for cold intolerance, heat intolerance and polyuria.  ?Genitourinary:  Negative for dyspareunia, dysuria, flank pain, frequency, genital sores, hematuria, menstrual problem, pelvic pain, urgency, vaginal bleeding, vaginal discharge and vaginal pain.  ?Musculoskeletal:  Negative for  back pain, joint swelling and myalgias.  ?Skin:  Negative for rash.  ?Neurological:  Negative for dizziness, syncope, light-headedness, numbness and headaches.  ?Hematological:  Negative for adenopathy.  ?Psychiatric/Behavioral:  Negative for agitation, confusion, sleep disturbance and suicidal ideas. The patient is not nervous/anxious.   ?BREAST: No symptoms ? ? ?Objective: ?BP 112/80   Ht 5\' 4"  (1.626 m)   Wt 213 lb (96.6 kg)   LMP 04/19/2021 (Exact Date)   BMI 36.56 kg/m?  ? ? ?Physical Exam ?Constitutional:   ?   Appearance: She is well-developed.  ?Genitourinary:  ?   Vulva normal.  ?   Right Labia: No  rash, tenderness or lesions. ?   Left Labia: No tenderness, lesions or rash. ?   No vaginal discharge, erythema or tenderness.  ? ?   Right Adnexa: not tender and no mass present. ?   Left Adnexa: not tender and no mass present. ?   No cervical friability or polyp.  ?   IUD strings visualized.  ?   Uterus is not enlarged or tender.  ?Breasts: ?   Right: No mass, nipple discharge, skin change or tenderness.  ?   Left: No mass, nipple discharge, skin change or tenderness.  ?Neck:  ?   Thyroid: No thyromegaly.  ?Cardiovascular:  ?   Rate and Rhythm: Normal rate and regular rhythm.  ?   Heart sounds: Normal heart sounds. No murmur heard. ?Pulmonary:  ?   Effort: Pulmonary effort is normal.  ?   Breath sounds: Normal breath sounds.  ?Abdominal:  ?   Palpations: Abdomen is soft.  ?   Tenderness: There is no abdominal tenderness. There is no guarding or rebound.  ?Musculoskeletal:     ?   General: Normal range of motion.  ?   Cervical back: Normal range of motion.  ?Lymphadenopathy:  ?   Cervical: No cervical adenopathy.  ?Neurological:  ?   General: No focal deficit present.  ?   Mental Status: She is alert and oriented to person, place, and time.  ?   Cranial Nerves: No cranial nerve deficit.  ?Skin: ?   General: Skin is warm and dry.  ?Psychiatric:     ?   Mood and Affect: Mood normal.     ?   Behavior: Behavior normal.     ?   Thought Content: Thought content normal.     ?   Judgment: Judgment normal.  ?Vitals reviewed.  ? ? ?Assessment/Plan: ?Encounter for annual routine gynecological examination ? ?Cervical cancer screening - Plan: Cytology - PAP ? ?Screening for HPV (human papillomavirus) - Plan: Cytology - PAP ? ?History of abnormal cervical Pap smear - Plan: Cytology - PAP ? ?Encounter for routine checking of intrauterine contraceptive device (IUD)--Paragard IUD due for removal 6/24. Will want another one.  ? ?          ?GYN counsel adequate intake of calcium and vitamin D, diet and exercise ? ? ?  F/U ? Return in  about 1 year (around 04/20/2022). ? ?Chalmers Iddings B. Genesis Paget, PA-C ?04/19/2021 ?2:02 PM ?

## 2021-04-19 ENCOUNTER — Encounter: Payer: Self-pay | Admitting: Obstetrics and Gynecology

## 2021-04-19 ENCOUNTER — Other Ambulatory Visit (HOSPITAL_COMMUNITY)
Admission: RE | Admit: 2021-04-19 | Discharge: 2021-04-19 | Disposition: A | Discharge status: Home / Self Care | Payer: 59 | Source: Ambulatory Visit | Attending: Obstetrics and Gynecology | Admitting: Obstetrics and Gynecology

## 2021-04-19 ENCOUNTER — Ambulatory Visit (INDEPENDENT_AMBULATORY_CARE_PROVIDER_SITE_OTHER): Payer: 59 | Admitting: Obstetrics and Gynecology

## 2021-04-19 VITALS — BP 112/80 | Ht 64.0 in | Wt 213.0 lb

## 2021-04-19 DIAGNOSIS — Z1151 Encounter for screening for human papillomavirus (HPV): Secondary | ICD-10-CM | POA: Diagnosis not present

## 2021-04-19 DIAGNOSIS — Z124 Encounter for screening for malignant neoplasm of cervix: Secondary | ICD-10-CM

## 2021-04-19 DIAGNOSIS — Z30431 Encounter for routine checking of intrauterine contraceptive device: Secondary | ICD-10-CM

## 2021-04-19 DIAGNOSIS — Z8742 Personal history of other diseases of the female genital tract: Secondary | ICD-10-CM | POA: Insufficient documentation

## 2021-04-19 DIAGNOSIS — Z01419 Encounter for gynecological examination (general) (routine) without abnormal findings: Secondary | ICD-10-CM | POA: Diagnosis not present

## 2021-04-19 NOTE — Patient Instructions (Signed)
I value your feedback and you entrusting us with your care. If you get a  patient survey, I would appreciate you taking the time to let us know about your experience today. Thank you! ? ? ?

## 2021-04-23 LAB — CYTOLOGY - PAP
Comment: NEGATIVE
Diagnosis: NEGATIVE
High risk HPV: NEGATIVE

## 2022-09-02 NOTE — Progress Notes (Unsigned)
PCP:  Danella Penton, MD   No chief complaint on file.    HPI:      Ms. Jenny Cervantes is a 37 y.o. G2P1011 whose LMP was No LMP recorded. (Menstrual status: IUD)., presents today for her annual examination.  Her menses are regular every 28-30 days, lasting 7 days, mod flow.  Dysmenorrhea mild, occurring first 1-2 days of flow. She has occas intermenstrual bleeding.  Sex activity: single partner, contraception - IUD. Paragard placed 07/20/12, will want another one next yr.  Last Pap: 04/19/21 Results were: no abnormalities /neg HPV DNA  There is a FH of breast cancer in her mat grt aunt, genetic testing not indicated. There is no FH of ovarian cancer. The patient does do self-breast exams.  Tobacco use: The patient denies current or previous tobacco use. Alcohol use: none No drug use.  Exercise: moderately active  She does get adequate calcium but not Vitamin D in her diet.  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  Social History   Socioeconomic History   Marital status: Married    Spouse name: Not on file   Number of children: Not on file   Years of education: Not on file   Highest education level: Not on file  Occupational History   Not on file  Tobacco Use   Smoking status: Never   Smokeless tobacco: Never  Vaping Use   Vaping status: Never Used  Substance and Sexual Activity   Alcohol use: Yes    Comment: Rare   Drug use: No   Sexual activity: Yes    Birth control/protection: I.U.D.    Comment: Paragard  Other Topics Concern   Not on file  Social History Narrative   Not on file   Social Determinants of Health   Financial  Resource Strain: Low Risk  (04/25/2022)   Received from [facility], [facility]   Overall Financial Resource Strain (CARDIA)    Difficulty of Paying Living Expenses: Not hard at all  Food Insecurity: No Food Insecurity (04/25/2022)   Received from [facility], [facility]   Hunger Vital Sign    Worried About Running Out of Food in the Last Year: Never true    Ran Out of Food in the Last Year: Never true  Transportation Needs: No Transportation Needs (04/25/2022)   Received from [facility], [facility]   [facility] - Transportation    In the past 12 months, has lack of transportation kept you from medical appointments or from getting medications?: No    Lack of Transportation (Non-Medical): No  Physical Activity: Not on file  Stress: Not on file  Social Connections: Not on file  Intimate Partner Violence: Not on file     Current Outpatient Medications:    lisinopril-hydrochlorothiazide (ZESTORETIC) 10-12.5 MG tablet, Take 1 tablet by mouth daily., Disp: , Rfl:    PARAGARD INTRAUTERINE COPPER IU, by Intrauterine route., Disp: , Rfl:    valACYclovir (VALTREX) 500 MG tablet, Take 500 mg by  mouth 2 (two) times daily., Disp: , Rfl:      ROS:  Review of Systems  Constitutional:  Negative for fatigue, fever and unexpected weight change.  Respiratory:  Negative for cough, shortness of breath and wheezing.   Cardiovascular:  Negative for chest pain, palpitations and leg swelling.  Gastrointestinal:  Negative for blood in stool, constipation, diarrhea, nausea and vomiting.  Endocrine: Negative for cold intolerance, heat intolerance and polyuria.  Genitourinary:  Negative for dyspareunia, dysuria, flank pain, frequency, genital sores, hematuria, menstrual problem, pelvic pain, urgency, vaginal bleeding, vaginal discharge and vaginal pain.  Musculoskeletal:  Negative for back pain, joint  swelling and myalgias.  Skin:  Negative for rash.  Neurological:  Negative for dizziness, syncope, light-headedness, numbness and headaches.  Hematological:  Negative for adenopathy.  Psychiatric/Behavioral:  Negative for agitation, confusion, sleep disturbance and suicidal ideas. The patient is not nervous/anxious.    BREAST: No symptoms   Objective: There were no vitals taken for this visit.   Physical Exam Constitutional:      Appearance: She is well-developed.  Genitourinary:     Vulva normal.     Right Labia: No rash, tenderness or lesions.    Left Labia: No tenderness, lesions or rash.    No vaginal discharge, erythema or tenderness.      Right Adnexa: not tender and no mass present.    Left Adnexa: not tender and no mass present.    No cervical friability or polyp.     IUD strings visualized.     Uterus is not enlarged or tender.  Breasts:    Right: No mass, nipple discharge, skin change or tenderness.     Left: No mass, nipple discharge, skin change or tenderness.  Neck:     Thyroid: No thyromegaly.  Cardiovascular:     Rate and Rhythm: Normal rate and regular rhythm.     Heart sounds: Normal heart sounds. No murmur heard. Pulmonary:     Effort: Pulmonary effort is normal.     Breath sounds: Normal breath sounds.  Abdominal:     Palpations: Abdomen is soft.     Tenderness: There is no abdominal tenderness. There is no guarding or rebound.  Musculoskeletal:        General: Normal range of motion.     Cervical back: Normal range of motion.  Lymphadenopathy:     Cervical: No cervical adenopathy.  Neurological:     General: No focal deficit present.     Mental Status: She is alert and oriented to person, place, and time.     Cranial Nerves: No cranial nerve deficit.  Skin:    General: Skin is warm and dry.  Psychiatric:        Mood and Affect: Mood normal.        Behavior: Behavior normal.        Thought Content: Thought content normal.        Judgment:  Judgment normal.  Vitals reviewed.     Assessment/Plan: Encounter for annual routine gynecological examination  Cervical cancer screening - Plan: Cytology - PAP  Screening for HPV (human papillomavirus) - Plan: Cytology - PAP  History of abnormal cervical Pap smear - Plan: Cytology - PAP  Encounter for routine checking of intrauterine contraceptive device (IUD)--Paragard IUD due for removal 6/24. Will want another one.             GYN counsel adequate intake of calcium and vitamin D, diet and exercise     F/U  No follow-ups on file.  Sana Tessmer B. Kanyia Heaslip, PA-C 09/02/2022 5:03 PM

## 2022-09-03 ENCOUNTER — Encounter: Payer: Self-pay | Admitting: Obstetrics and Gynecology

## 2022-09-03 ENCOUNTER — Ambulatory Visit (INDEPENDENT_AMBULATORY_CARE_PROVIDER_SITE_OTHER): Payer: 59 | Admitting: Obstetrics and Gynecology

## 2022-09-03 ENCOUNTER — Ambulatory Visit (INDEPENDENT_AMBULATORY_CARE_PROVIDER_SITE_OTHER): Payer: 59

## 2022-09-03 VITALS — BP 110/74 | Ht 64.0 in | Wt 208.0 lb

## 2022-09-03 DIAGNOSIS — T8331XA Breakdown (mechanical) of intrauterine contraceptive device, initial encounter: Secondary | ICD-10-CM | POA: Diagnosis not present

## 2022-09-03 DIAGNOSIS — Z30431 Encounter for routine checking of intrauterine contraceptive device: Secondary | ICD-10-CM

## 2022-09-03 DIAGNOSIS — Z30432 Encounter for removal of intrauterine contraceptive device: Secondary | ICD-10-CM

## 2022-09-03 DIAGNOSIS — Z01419 Encounter for gynecological examination (general) (routine) without abnormal findings: Secondary | ICD-10-CM

## 2022-09-03 NOTE — Patient Instructions (Addendum)
I value your feedback and you entrusting us with your care. If you get a [facility] patient survey, I would appreciate you taking the time to let us know about your experience today. Thank you!  Instructions after IUD insertion  Most women experience no significant problems after insertion of an IUD, however minor cramping and spotting for a few days is common. Cramps may be treated with ibuprofen 800mg every 8 hours or Tylenol 650 mg every 4 hours. Contact [facility] immediately if you experience any of the following symptoms during the next week: temperature >99.6 degrees, worsening pelvic pain, abdominal pain, fainting, unusually heavy vaginal bleeding, foul vaginal discharge, or if you think you have expelled the IUD. Nothing inserted in the vagina for 48 hours. You will be scheduled for a follow up visit in approximately four weeks.    

## 2022-09-17 NOTE — Progress Notes (Unsigned)
GYNECOLOGY CLINIC PROGRESS NOTE Subjective:   Jenny Cervantes is a 37 y.o. G2P1011 here for discussion of surgical management retained IUD fragment. Patient underwent IUD removal (with planned replacement) of her Paragard IUD during her annual exam, performed by [employer], PA-C. During removal, it was noted that IUD was removed but was not intact, 1 arm missing from the IUD.  Patient currently denies any pain or any abnormal uterine bleeding.      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   INTRAUTERINE DEVICE (IUD) INSERTION  2006, 2011   Paragard    OB History  Gravida Para Term Preterm AB Living  2 1 1   1 1   SAB IAB Ectopic Multiple Live Births          1    # Outcome Date GA Lbr Len/2nd Weight Sex Type Anes PTL Lv  2 Term 05/17/09   7 lb 2 oz (3.232 kg) F Vag-Spont  N LIV  1 AB             Family History  Problem Relation Age of Onset   Breast cancer Other     Social History   Socioeconomic History   Marital status: Married    Spouse name: Not on file   Number of children: Not on file   Years of education: Not on file   Highest education level: Not on file  Occupational History   Not on file  Tobacco Use   Smoking status: Never   Smokeless tobacco: Never  Vaping Use   Vaping status: Never Used  Substance and Sexual Activity   Alcohol use: Yes    Comment: Rare   Drug use: No   Sexual activity: Yes    Birth control/protection: I.U.D.    Comment: Paragard  Other Topics Concern   Not on file  Social History Narrative    Not on file   Social Determinants of Health   Financial Resource Strain: Low Risk  (04/25/2022)   Received from [facility], [facility]   Overall Financial Resource Strain (CARDIA)    Difficulty of Paying Living Expenses: Not hard at all  Food Insecurity: No Food Insecurity (04/25/2022)   Received from [facility], [facility]   Hunger Vital Sign    Worried About Running Out of Food in the Last Year: Never true    Ran Out of Food in the Last Year: Never true  Transportation Needs: No Transportation Needs (04/25/2022)   Received from [facility], [facility]   [facility] - Transportation    In the past 12 months, has lack of transportation kept you from medical appointments or from getting medications?: No    Lack of Transportation (Non-Medical): No  Physical Activity: Not on file  Stress: Not on file  Social Connections: Not on file  Intimate Partner Violence: Not on file    Current Outpatient Medications on File Prior to Visit  Medication Sig  Dispense Refill   lisinopril-hydrochlorothiazide (ZESTORETIC) 10-12.5 MG tablet Take 1 tablet by mouth daily.     PARAGARD INTRAUTERINE COPPER IU by Intrauterine route.     valACYclovir (VALTREX) 500 MG tablet Take 500 mg by mouth 2 (two) times daily.     No current facility-administered medications on file prior to visit.    Allergies  Allergen Reactions   Sulfa Antibiotics Other (See Comments)    Other Reaction: throat swelling   Penicillins Hives and Rash   Sertraline Other (See Comments)     Review of Systems Constitutional: No recent fever/chills/sweats Respiratory: No recent cough/bronchitis Cardiovascular: No chest pain Gastrointestinal: No recent nausea/vomiting/diarrhea Genitourinary: No UTI symptoms Hematologic/lymphatic:No history of coagulopathy or recent blood thinner use    Objective:   Blood pressure (!) 120/91,  pulse 68, resp. rate 16, height 5\' 3"  (1.6 m), weight 204 lb 11.2 oz (92.9 kg), last menstrual period 09/03/2022. CONSTITUTIONAL: Well-developed, well-nourished female in no acute distress.  SKIN: Skin is warm and dry. No rash noted. Not diaphoretic. No erythema. No pallor. NEUROLOGIC: Alert and oriented to person, place, and time. Normal reflexes, muscle tone coordination. No cranial nerve deficit noted. PELVIS: Deferred   Labs: No results found for this or any previous visit (from the past 336 hour(s)).   Imaging Studies: US PELVIS TRANSVAGINAL NON-OB (TV ONLY)  Result Date: 09/07/2022 ULTRASOUND REPORT  Location: [facility] Date of Service: 09/04/2022    Indications: Lost IUD arm with extraction of IUD Findings: Within the Uterus there is an echogenic foci that correlates to lost IUD arm. Area is  4.68 cm from internal cervical os within the posterior part of uterus in sagittal plane. Correlating to left side of uterus in the transverse plane.  The Endometrium thickened  Right Ovary not evaluated on todays exam Left Ovary not evaluated on todays exam Survey of the adnexa demonstrates no adnexal masses. There is no free fluid in the cul de sac.  Impression: 1. Unattached IUD arm appears to be within the left mid uterine segment. 4.68 cm from internal cervical os.  Recommendations: 1.Clinical correlation with the patient's History and Physical Exam. 2. Follow up with provider for recommendations  Waldo Laine, RT The ultrasound images and findings were reviewed by me and I agree with the above report. Elonda Husky, M.D. 09/07/2022 2:34 PM    Assessment:    1. Mechanical breakdown of intrauterine contraceptive device, initial encounter   Plan:   Discussion had with patient regarding options removal of fragment of the IUD, including in office hysteroscopic removal under light sedation using anxiolytic and PO pain medications vs hospital procedure under general anesthesia.  Discussed risks and benefits of each option and procedure in general. Patient notes she would like to try in office.  Understands that if unsuccessful, will then need to proceed with procedure in the  hospital. Will schedule in 1 week, as patient set to have onset of menses the following week. Is deciding on replacement IUD vs vasectomy for conception.  Sent prescription in for pre-procedure Valium. Patient declines strong pain medication, will take Ibuprofen prior to procedure.  Will also perform bedside ultrasound prior to procedure as patient notes menstrual cycle several days after after IUD removal and wants to see if it has expelled naturally prior to undergoing procedure.     A total of 25 minutes were spent during this encounter, including review of previous progress notes, recent imaging and labs, face-to-face with time with patient involving  counseling and coordination of care, as well as documentation for current visit.    Hildred Laser, MD [facility]

## 2022-09-18 ENCOUNTER — Ambulatory Visit: Payer: 59 | Admitting: Obstetrics and Gynecology

## 2022-09-18 ENCOUNTER — Encounter: Payer: Self-pay | Admitting: Obstetrics and Gynecology

## 2022-09-18 VITALS — BP 120/91 | HR 68 | Resp 16 | Ht 63.0 in | Wt 204.7 lb

## 2022-09-18 DIAGNOSIS — Z01818 Encounter for other preprocedural examination: Secondary | ICD-10-CM

## 2022-09-18 DIAGNOSIS — T8331XA Breakdown (mechanical) of intrauterine contraceptive device, initial encounter: Secondary | ICD-10-CM

## 2022-09-18 MED ORDER — DIAZEPAM 5 MG PO TABS: 5.0000 mg | ORAL_TABLET | Freq: Once | ORAL | 0 refills | Status: AC

## 2022-09-25 ENCOUNTER — Other Ambulatory Visit (INDEPENDENT_AMBULATORY_CARE_PROVIDER_SITE_OTHER): Payer: 59

## 2022-09-25 ENCOUNTER — Ambulatory Visit (INDEPENDENT_AMBULATORY_CARE_PROVIDER_SITE_OTHER): Payer: 59 | Admitting: Obstetrics and Gynecology

## 2022-09-25 ENCOUNTER — Encounter: Payer: Self-pay | Admitting: Obstetrics and Gynecology

## 2022-09-25 VITALS — BP 118/87 | HR 80 | Resp 16 | Ht 64.0 in | Wt 204.7 lb

## 2022-09-25 DIAGNOSIS — N80129 Deep endometriosis of ovary, unspecified ovary: Secondary | ICD-10-CM

## 2022-09-25 DIAGNOSIS — T8331XD Breakdown (mechanical) of intrauterine contraceptive device, subsequent encounter: Secondary | ICD-10-CM | POA: Diagnosis not present

## 2022-09-25 NOTE — Progress Notes (Signed)
GYNECOLOGY PROGRESS NOTE  Subjective:    Patient ID: Jenny Cervantes, female    DOB: 02-12-1985, 37 y.o.   MRN: [id number]  HPI  Patient is a 37 y.o. G8P1011 female who presents for evaluation of lost IUD piece(had IUD removal on 09/03/22 with 1 arm of the IUD retained after removal). Patient was schedule to have an Endosee done today for hysteroscopic removal of remaining IUD arm, but when she went to the bathroom and wiped on Sunday she noticed that the piece had come out on the tissue. She did have some weird feeling on her left side on Saturday, did not describe it as pain. She is her just to make sure it is completely out.  She brought the fragment in with her today.  The following portions of the patient's history were reviewed and updated as appropriate: allergies, current medications, past family history, past medical history, past social history, past surgical history, and problem list.  Review of Systems Pertinent items are noted in HPI.   Objective:  Blood pressure 118/87, pulse 80, resp. rate 16, height 5\' 4"  (1.626 m), weight 204 lb 11.2 oz (92.9 kg), last menstrual period 09/03/2022. Body mass index is 35.14 kg/m. General appearance: alert and no distress Remainder of exam deferred.   Imaging:  Waldo Laine, RT on 09/25/2022  4:06 PM  ULTRASOUND REPORT   Location: [facility] Date of Service: 09/25/2022        Indications: Check for Lost IUD arm Findings:  The uterus is anteverted and measures 10.01 x 6.65 x 5.51 cm. Echo texture is homogenous without evidence of focal masses.     The Endometrium measures 13.14 mm. - cystic area within fundal aspect probably blood product measuring;  1.18 x 0.29 x 1.04 cm   Right Ovary not well seen Left Ovary measures 4.04 x 3.53 x 3.38 cm. It is not normal in appearance. Complex cyst vs chocolate cyst measuring ; 3.11 x 3.06 x 2.70 cm Survey of the adnexa demonstrates no adnexal masses. There is no  free fluid in the cul de sac.   Impression: 1. Fluid within endometrium likely blood product 2. Probably chocolate cyst seen on LT ovary   Recommendations: 1.Clinical correlation with the patient's History and Physical Exam. 2. Follow up with provider    Waldo Laine, RT     Assessment:   1. Mechanical breakdown of intrauterine contraceptive device, subsequent encounter   2. Endometrioma of ovary    Plan:   - Ultrasound performed today to confirm no remaining fragments in the uterine cavity.  No evidence of retained IUD fragments noted today.  However incidental finding of left ovary with a small complex cyst suspected to be a chocolate cyst, likely suggestive of endometriosis.  I discussed with patient regarding if she has had any symptoms.  Patient notes that she has fairly normal periods that last 6 days and all of the first 2 are heavy but tolerable.  Had a ParaGard IUD in for some time and never noted any other problems with her cycles.  Advised that at this time because the cyst is small it does not require intervention however if she does begin to note any symptoms of endometriosis (heavy or painful periods, dyspareunia, or pelvic pain) to notify MD and can discuss other management options. - Reports that she will utilize condoms for short-term contraception and that her husband plans to get a vasectomy next month. - To follow-up as needed  A total of 15 minutes were spent face-to-face with the patient during this encounter and over half of that time dealt with counseling and coordination of care.  Hildred Laser, MD [facility]

## 2023-06-03 ENCOUNTER — Other Ambulatory Visit: Payer: Self-pay

## 2023-06-03 DIAGNOSIS — N390 Urinary tract infection, site not specified: Secondary | ICD-10-CM

## 2023-06-04 ENCOUNTER — Ambulatory Visit: Admitting: Urology

## 2023-06-04 ENCOUNTER — Other Ambulatory Visit
Admission: RE | Admit: 2023-06-04 | Discharge: 2023-06-04 | Disposition: A | Discharge status: Home / Self Care | Attending: Urology | Admitting: Urology

## 2023-06-04 VITALS — BP 135/86 | HR 70 | Ht 64.0 in | Wt 198.0 lb

## 2023-06-04 DIAGNOSIS — N390 Urinary tract infection, site not specified: Secondary | ICD-10-CM

## 2023-06-04 DIAGNOSIS — R8271 Bacteriuria: Secondary | ICD-10-CM | POA: Diagnosis not present

## 2023-06-04 LAB — URINALYSIS, COMPLETE (UACMP) WITH MICROSCOPIC
Bilirubin Urine: NEGATIVE
Glucose, UA: NEGATIVE mg/dL
Ketones, ur: NEGATIVE mg/dL
Leukocytes,Ua: NEGATIVE
Nitrite: NEGATIVE
Protein, ur: NEGATIVE mg/dL
Specific Gravity, Urine: >1.03 — ABNORMAL HIGH (ref 1.005–1.030)
pH: 5.5 (ref 5.0–8.0)

## 2023-06-04 LAB — BLADDER SCAN AMB NON-IMAGING

## 2023-06-04 NOTE — Patient Instructions (Addendum)
 Asymptomatic Bacteriuria Asymptomatic bacteriuria is when you have a lot of germs called bacteria in your pee (urine), but they don't cause symptoms.  You may not need treatment for this condition. But you may be more likely to get an infection in the future. What are the causes? You may get more germs in your pee because of: Germs going into your urinary tract. Your urinary tract includes your kidneys, ureters, bladder, and urethra. Germs can get into your urinary tract during sex. A blockage in your urinary tract. This may be from a kidney stone or from an abnormal growth of cells called a tumor. Bladder problems that stop the bladder from emptying. What increases the risk? You're more likely to get this condition if: You have diabetes. You're an older adult. You're even more likely to get it if you live in a long-term care facility. You're female. You're pregnant. You have kidney stones. You've had a kidney transplant. You have a soft tube called a catheter that drains your pee. What are the signs or symptoms? There are no symptoms. How is this diagnosed? This condition is diagnosed with a pee test. It may be found when a sample of pee is taken to treat another condition, such as a problem with your kidney. If you're in your first trimester of pregnancy, you may be screened for this condition. How is this treated? In most cases, treatment isn't needed. Treating the condition can lead to other problems, such as: A yeast infection. The growth of antibiotic-resistant bacteria. These are germs that don't respond to treatment. But some people need to take antibiotics to prevent a kidney infection. You may need treatment if: You're having a procedure that affects your urinary tract. You've had a kidney transplant. You're pregnant. A kidney infection during pregnancy can lead to: Early labor. Very low birth weight. Newborn death. Follow these instructions at home: Medicines Take your  medicines only as told. Take your antibiotics as told. Do not stop taking them even if you start to feel better. General instructions Closely watch your condition for any changes. Drink enough fluid to keep your pee pale yellow. Pee more often to keep your bladder empty. If you're female, keep the area around your vagina and butt clean. Wipe from front to back after you pee or poop. Use each tissue only once when you wipe. Contact a health care provider if: You have symptoms of an infection. These symptoms may include: A burning feeling, or pain when you pee. A strong need to pee. Peeing more often. Pee that turns discolored or cloudy. Blood in your pee. Pee that smells bad or odd. A fever or chills. You have very bad pain in your back or lower belly. You faint. This information is not intended to replace advice given to you by your health care provider. Make sure you discuss any questions you have with your health care provider.  Kegel Exercises  Kegel exercises can help strengthen your pelvic floor muscles. The pelvic floor is a group of muscles that support your rectum, small intestine, and bladder. In females, pelvic floor muscles also help support the uterus. These muscles help you control the flow of urine and stool (feces). Kegel exercises are painless and simple. They do not require any equipment. Your provider may suggest Kegel exercises to: Improve bladder and bowel control. Improve sexual response. Improve weak pelvic floor muscles after surgery to remove the uterus (hysterectomy) or after pregnancy, in females. Improve weak pelvic floor muscles after prostate  gland removal or surgery, in males. Kegel exercises involve squeezing your pelvic floor muscles. These are the same muscles you squeeze when you try to stop the flow of urine or keep from passing gas. The exercises can be done while sitting, standing, or lying down, but it is best to vary your position. Ask your health  care provider which exercises are safe for you. Do exercises exactly as told by your health care provider and adjust them as directed. Do not begin these exercises until told by your health care provider. Exercises How to do Kegel exercises: Squeeze your pelvic floor muscles tight. You should feel a tight lift in your rectal area. If you are a female, you should also feel a tightness in your vaginal area. Keep your stomach, buttocks, and legs relaxed. Hold the muscles tight for up to 10 seconds. Breathe normally. Relax your muscles for up to 10 seconds. Repeat as told by your health care provider. Repeat this exercise daily as told by your health care provider. Continue to do this exercise for at least 4-6 weeks, or for as long as told by your health care provider. You may be referred to a physical therapist who can help you learn more about how to do Kegel exercises. Depending on your condition, your health care provider may recommend: Varying how long you squeeze your muscles. Doing several sets of exercises every day. Doing exercises for several weeks. Making Kegel exercises a part of your regular exercise routine. This information is not intended to replace advice given to you by your health care provider. Make sure you discuss any questions you have with your health care provider. Document Revised: 05/18/2020 Document Reviewed: 05/18/2020 Elsevier Patient Education  2024 [employer].

## 2023-06-04 NOTE — Progress Notes (Signed)
   06/04/23 2:54 PM   Starling Eck Leim Pupa October 19, 1985 [id number]  CC: Asymptomatic bacteriuria  HPI: 38 year old female referred for asymptomatic bacteriuria.  She has had intermittent urine samples with pyuria and bacteria present, but has not had any symptoms or UTIs.  She has not been on any antibiotics.  She denies any gross hematuria or dysuria.  She has some very mild urgency and occasional dribbling that is not bothersome.  She does not to wear a pad.  Urinalysis today with bacteria but otherwise benign consistent with chronic asymptomatic bacteriuria.   PMH: Past Medical History:  Diagnosis Date   B12 deficiency    Cold sore    Hypertension    sx improved, may be able to come off meds   PIH (pregnancy induced hypertension)     Surgical History: Past Surgical History:  Procedure Laterality Date   CARDIAC SURGERY  2000   Tumor removed   INTRAUTERINE DEVICE (IUD) INSERTION  2006, 2011   Paragard    Family History: Family History  Problem Relation Age of Onset   Breast cancer Other     Social History:  reports that she has never smoked. She has never used smokeless tobacco. She reports current alcohol use. She reports that she does not use drugs.  Physical Exam: BP 135/86 (BP Location: Left Arm, Patient Position: Sitting, Cuff Size: Large)   Pulse 70   Ht 5\' 4"  (1.626 m)   Wt 198 lb (89.8 kg)   LMP 06/04/2023   SpO2 95%   BMI 33.99 kg/m    Constitutional:  Alert and oriented, No acute distress. Cardiovascular: No clubbing, cyanosis, or edema. Respiratory: Normal respiratory effort, no increased work of breathing. GI: Abdomen is soft, nontender, nondistended, no abdominal masses   Laboratory Data: Reviewed, see HPI  Assessment & Plan:   38 year old female with asymptomatic bacteriuria, we reviewed that this does not require treatment or further evaluation, reassurance was provided.  Follow-up with urology as needed   Jay Meth,  MD 06/04/2023  [facility] [address], [address] [place], [place] [address] [phone]
